# Patient Record
Sex: Female | Born: 1970 | Race: White | Hispanic: No | Marital: Married | Smoking: Former smoker
Health system: Southern US, Community
[De-identification: ages and names within clinical notes are randomized; demographics above are authoritative.]

## PROBLEM LIST (undated history)

## (undated) ENCOUNTER — Emergency Department (HOSPITAL_COMMUNITY): Admission: EM | Discharge status: Against Medical Advice | Payer: BC Managed Care – PPO

## (undated) DIAGNOSIS — M549 Dorsalgia, unspecified: Secondary | ICD-10-CM

## (undated) DIAGNOSIS — K219 Gastro-esophageal reflux disease without esophagitis: Secondary | ICD-10-CM

## (undated) DIAGNOSIS — G8929 Other chronic pain: Secondary | ICD-10-CM

## (undated) DIAGNOSIS — E079 Disorder of thyroid, unspecified: Secondary | ICD-10-CM

## (undated) HISTORY — DX: Disorder of thyroid, unspecified: E07.9

## (undated) HISTORY — DX: Dorsalgia, unspecified: M54.9

## (undated) HISTORY — PX: AUGMENTATION MAMMAPLASTY: SUR837

## (undated) HISTORY — DX: Other chronic pain: G89.29

## (undated) HISTORY — PX: APPENDECTOMY: SHX54

## (undated) HISTORY — DX: Gastro-esophageal reflux disease without esophagitis: K21.9

---

## 1996-01-01 HISTORY — PX: PLACEMENT OF BREAST IMPLANTS: SHX6334

## 2010-01-26 ENCOUNTER — Encounter: Admission: RE | Admit: 2010-01-26 | Discharge: 2010-01-26 | Payer: Self-pay | Admitting: Internal Medicine

## 2010-07-11 ENCOUNTER — Encounter: Admission: RE | Admit: 2010-07-11 | Discharge: 2010-07-11 | Payer: Self-pay | Admitting: Internal Medicine

## 2011-05-21 ENCOUNTER — Other Ambulatory Visit: Payer: Self-pay | Admitting: Internal Medicine

## 2011-05-21 DIAGNOSIS — E049 Nontoxic goiter, unspecified: Secondary | ICD-10-CM

## 2011-05-22 ENCOUNTER — Ambulatory Visit
Admission: RE | Admit: 2011-05-22 | Discharge: 2011-05-22 | Disposition: A | Discharge status: Home / Self Care | Payer: BC Managed Care – PPO | Source: Ambulatory Visit | Attending: Internal Medicine | Admitting: Internal Medicine

## 2011-05-22 DIAGNOSIS — E049 Nontoxic goiter, unspecified: Secondary | ICD-10-CM

## 2012-02-19 ENCOUNTER — Ambulatory Visit: Payer: Self-pay | Admitting: Family Medicine

## 2014-12-14 ENCOUNTER — Ambulatory Visit: Payer: Self-pay | Admitting: Unknown Physician Specialty

## 2015-05-02 ENCOUNTER — Ambulatory Visit: Admission: EM | Admit: 2015-05-02 | Discharge: 2015-05-02 | Discharge status: Absent without leave | Payer: Self-pay

## 2016-01-16 ENCOUNTER — Other Ambulatory Visit: Payer: Self-pay | Admitting: Physician Assistant

## 2016-01-16 DIAGNOSIS — R079 Chest pain, unspecified: Secondary | ICD-10-CM

## 2016-01-16 DIAGNOSIS — R1012 Left upper quadrant pain: Secondary | ICD-10-CM

## 2016-01-26 ENCOUNTER — Ambulatory Visit: Payer: Self-pay

## 2016-01-26 ENCOUNTER — Ambulatory Visit
Admission: RE | Admit: 2016-01-26 | Discharge: 2016-01-26 | Disposition: A | Discharge status: Home / Self Care | Payer: BLUE CROSS/BLUE SHIELD | Source: Ambulatory Visit | Attending: Physician Assistant | Admitting: Physician Assistant

## 2016-01-26 DIAGNOSIS — R079 Chest pain, unspecified: Secondary | ICD-10-CM | POA: Insufficient documentation

## 2016-01-26 DIAGNOSIS — R1012 Left upper quadrant pain: Secondary | ICD-10-CM | POA: Diagnosis present

## 2016-01-26 DIAGNOSIS — R911 Solitary pulmonary nodule: Secondary | ICD-10-CM | POA: Insufficient documentation

## 2016-01-26 DIAGNOSIS — K760 Fatty (change of) liver, not elsewhere classified: Secondary | ICD-10-CM | POA: Diagnosis not present

## 2016-01-26 MED ORDER — IOHEXOL 350 MG/ML SOLN
75.0000 mL | Freq: Once | INTRAVENOUS | Status: AC | PRN
  Administered 2016-01-26: 75 mL via INTRAVENOUS

## 2016-01-31 ENCOUNTER — Other Ambulatory Visit: Payer: Self-pay | Admitting: Physician Assistant

## 2016-01-31 DIAGNOSIS — R911 Solitary pulmonary nodule: Secondary | ICD-10-CM

## 2016-02-08 ENCOUNTER — Ambulatory Visit: Payer: BLUE CROSS/BLUE SHIELD

## 2016-04-03 ENCOUNTER — Ambulatory Visit
Admission: RE | Admit: 2016-04-03 | Discharge: 2016-04-03 | Disposition: A | Discharge status: Home / Self Care | Payer: BLUE CROSS/BLUE SHIELD | Source: Ambulatory Visit | Attending: Physician Assistant | Admitting: Physician Assistant

## 2016-04-03 DIAGNOSIS — R911 Solitary pulmonary nodule: Secondary | ICD-10-CM | POA: Insufficient documentation

## 2017-01-28 DIAGNOSIS — Z23 Encounter for immunization: Secondary | ICD-10-CM | POA: Diagnosis not present

## 2017-02-25 ENCOUNTER — Other Ambulatory Visit: Payer: Self-pay | Admitting: Family Medicine

## 2017-02-25 ENCOUNTER — Encounter: Payer: Self-pay | Admitting: Gastroenterology

## 2017-02-25 DIAGNOSIS — K219 Gastro-esophageal reflux disease without esophagitis: Secondary | ICD-10-CM | POA: Diagnosis not present

## 2017-02-25 DIAGNOSIS — Z87898 Personal history of other specified conditions: Secondary | ICD-10-CM

## 2017-02-25 DIAGNOSIS — M79622 Pain in left upper arm: Secondary | ICD-10-CM

## 2017-02-25 DIAGNOSIS — J309 Allergic rhinitis, unspecified: Secondary | ICD-10-CM | POA: Diagnosis not present

## 2017-02-25 DIAGNOSIS — E039 Hypothyroidism, unspecified: Secondary | ICD-10-CM | POA: Diagnosis not present

## 2017-02-25 DIAGNOSIS — Z23 Encounter for immunization: Secondary | ICD-10-CM | POA: Diagnosis not present

## 2017-02-25 DIAGNOSIS — Z6824 Body mass index (BMI) 24.0-24.9, adult: Secondary | ICD-10-CM | POA: Diagnosis not present

## 2017-02-27 ENCOUNTER — Ambulatory Visit
Admission: RE | Admit: 2017-02-27 | Discharge: 2017-02-27 | Disposition: A | Discharge status: Home / Self Care | Payer: BLUE CROSS/BLUE SHIELD | Source: Ambulatory Visit | Attending: Family Medicine | Admitting: Family Medicine

## 2017-02-27 ENCOUNTER — Other Ambulatory Visit: Payer: Self-pay | Admitting: Family Medicine

## 2017-02-27 DIAGNOSIS — Z87898 Personal history of other specified conditions: Secondary | ICD-10-CM

## 2017-02-27 DIAGNOSIS — N644 Mastodynia: Secondary | ICD-10-CM | POA: Diagnosis not present

## 2017-02-27 DIAGNOSIS — M79622 Pain in left upper arm: Secondary | ICD-10-CM

## 2017-02-27 DIAGNOSIS — R922 Inconclusive mammogram: Secondary | ICD-10-CM | POA: Diagnosis not present

## 2017-03-28 ENCOUNTER — Encounter: Payer: Self-pay | Admitting: Gastroenterology

## 2017-03-28 ENCOUNTER — Ambulatory Visit (INDEPENDENT_AMBULATORY_CARE_PROVIDER_SITE_OTHER): Payer: BLUE CROSS/BLUE SHIELD | Admitting: Gastroenterology

## 2017-03-28 VITALS — BP 114/70 | HR 72 | Ht 66.93 in | Wt 155.4 lb

## 2017-03-28 DIAGNOSIS — R1012 Left upper quadrant pain: Secondary | ICD-10-CM | POA: Diagnosis not present

## 2017-03-28 NOTE — Progress Notes (Signed)
HPI :  46 y/o female with a presenting with a history of chronic upper back pain and hypothyroidism, presenting for a new patient evaluation for several years of LUQ pain. .  Pain has been present for 8 years. Pain in the LUQ, tender to touch. Severity can fluctuate over time. Intense pain comes and goes, but baseline mild pain is always there, present 24/7 never goes away. On a good day rated 3/10, can be up to 10/10 at its worst. Severe pain has occurred about 5 times total to this level. Pain is a burning type of discomfort. She does not think eating will make it worse. No nausea or vomiting. She has rare loose stools but not common. She has a history of rectal discomfort for which she had a colonoscopy for in the past, which she was told she had hemorrhoids. Having a bowel movement does not take away the pain in her abdomen. No blood in the stools. She does have some occasional pyrosis. She was on prilosec which did not take away the pain in her abdomen. No weight loss, she is gaining weight. She can feel early satiety at times.   She had a colonoscopy around 4 years. She also had an upper endoscopy done for her pain around the same - which did not show a cause, although told she had some inflammation of her esophagus and benign stomach polyps.   She has had a CT scan of her chest in the past as well as MRI back. No abdominal imaging.   She was previously on lyrica which did not help her abdomen.   Father had lymphoma in his 5450s.  Past Medical History:  Diagnosis Date  . Chronic back pain   . GERD (gastroesophageal reflux disease)   . Thyroid disease      Past Surgical History:  Procedure Laterality Date  . APPENDECTOMY    . PLACEMENT OF BREAST IMPLANTS  1997   Family History  Problem Relation Age of Onset  . Diabetes Mother   . Osteoporosis Mother   . Bipolar disorder Mother   . Lymphoma Father   . Kidney disease Maternal Grandmother   . Diabetes Paternal Grandmother   .  Colon cancer Neg Hx   . Stomach cancer Neg Hx   . Rectal cancer Neg Hx   . Esophageal cancer Neg Hx   . Liver cancer Neg Hx    Social History  Substance Use Topics  . Smoking status: Former Games developermoker  . Smokeless tobacco: Never Used  . Alcohol use Yes     Comment: occasionally   Current Outpatient Prescriptions  Medication Sig Dispense Refill  . levothyroxine (SYNTHROID, LEVOTHROID) 125 MCG tablet TAKE 1 TABLET ONCE DAILY WITH A FULL GLASS OF WATER ON EMPTY STOMACH 30-60MINS BEFORE BREAKFAST     No current facility-administered medications for this visit.    No Known Allergies   Review of Systems: All systems reviewed and negative except where noted in HPI.   No results found for: WBC, HGB, HCT, MCV, PLT    Physical Exam: BP 114/70   Pulse 72   Ht 5' 6.93" (1.7 m)   Wt 155 lb 6 oz (70.5 kg)   BMI 24.39 kg/m  Constitutional: Pleasant,well-developed, female in no acute distress. HEENT: Normocephalic and atraumatic. Conjunctivae are normal. No scleral icterus. Neck supple.  Cardiovascular: Normal rate, regular rhythm.  Pulmonary/chest: Effort normal and breath sounds normal. No wheezing, rales or rhonchi. Abdominal: Soft, nondistended, focal tenderness to the  LUQ along costal margin. Carnett sign negative, There are no masses palpable. No hepatomegaly. Extremities: no edema Lymphadenopathy: No cervical adenopathy noted. Neurological: Alert and oriented to person place and time. Skin: Skin is warm and dry. No rashes noted. Psychiatric: Normal mood and affect. Behavior is normal.   ASSESSMENT AND PLAN: 46 year old female with chronic upper back pain, presenting with chronic left upper quadrant pain as described above. Pain is constant present 24/7, burning like quality, no relation to eating. Upper endoscopy and colonoscopy previously negative. She's had no prior abdominal imaging.  I suspect she most likely has abdominal wall pain / neuropathic pain given her description  of symptoms and stability over several years. That being said she's never had prior cross-sectional imaging of the abdomen from what I can gather, and recommend she have this done with a CT scan, ensure spleen and pancreas are normal. In the interim recommend a trial of capsaicin cream applied to the abdominal wall every 8 hours. If her CT scan is negative, which may likely be the case, and her symptoms persist, we can consider referral to pain management for trigger point injection / nerve block which may be the best way to manage this. I discussed abdominal wall pain at length with her and reassured her. She agreed with the plan.  Ileene Patrick, MD [facility] Pager [phone]  CC: Lewis Moccasin, MD

## 2017-03-28 NOTE — Patient Instructions (Addendum)
If you are age 44 or older, your body mass index should be between 23-30. Your Body mass index is 24.39 kg/m. If this is out of the aforementioned range listed, please consider follow up with your Primary Care Provider.  If you are age 46 or younger, your body mass index should be between 19-25. Your Body mass index is 24.39 kg/m. If this is out of the aformentioned range listed, please consider follow up with your Primary Care Provider.   You have been scheduled for a CT scan of the abdomen and pelvis at [facility] ([address]---this is in the same building as Press [facility]).   You are scheduled on Tuesday, April 10th at 2:00pm. You should arrive 15 minutes prior to your appointment time for registration. Please follow the written instructions below on the day of your exam:  WARNING: IF YOU ARE ALLERGIC TO IODINE/X-RAY DYE, PLEASE NOTIFY RADIOLOGY IMMEDIATELY AT [phone]! YOU WILL BE GIVEN A 13 HOUR PREMEDICATION PREP.  1) Do not eat anything after 10:00am (4 hours prior to your test) You may have liquids. 2) You have been given 2 bottles of oral contrast to drink. The solution may taste  better if refrigerated, but do NOT add ice or any other liquid to this solution. Shake well before drinking.    Drink 1 bottle of contrast @ 12:00pm (2 hours prior to your exam)  Drink 1 bottle of contrast @ 1:00pm    (1 hour prior to your exam)  You may take any medications as prescribed with a small amount of water except for the following: Metformin, Glucophage, Glucovance, Avandamet, Riomet, Fortamet, Actoplus Met, Janumet, Glumetza or Metaglip. The above medications must be held the day of the exam AND 48 hours after the exam.  The purpose of you drinking the oral contrast is to aid in the visualization of your intestinal tract. The contrast solution may cause some diarrhea. Before your exam is started, you will be given a small amount of fluid to drink. Depending on your  individual set of symptoms, you may also receive an intravenous injection of x-ray contrast/dye. Plan on being at [facility] for 30 minutes or longer, depending on the type of exam you are having performed.  This test typically takes 30-45 minutes to complete.  If you have any questions regarding your exam or if you need to reschedule, you may call the CT department at [phone] between the hours of 8:00 am and 5:00 pm, Monday-Friday.  ________________________________________________________________________  Please purchase Capsaicin Cream over the counter.  Will will contact you with the results of your CT Scan.  Thank you.

## 2017-04-03 ENCOUNTER — Other Ambulatory Visit: Payer: Self-pay

## 2017-04-03 DIAGNOSIS — R1012 Left upper quadrant pain: Secondary | ICD-10-CM

## 2017-04-09 ENCOUNTER — Ambulatory Visit (INDEPENDENT_AMBULATORY_CARE_PROVIDER_SITE_OTHER)
Admission: RE | Admit: 2017-04-09 | Discharge: 2017-04-09 | Disposition: A | Discharge status: Home / Self Care | Payer: BLUE CROSS/BLUE SHIELD | Source: Ambulatory Visit | Attending: Gastroenterology | Admitting: Gastroenterology

## 2017-04-09 ENCOUNTER — Inpatient Hospital Stay: Admission: RE | Admit: 2017-04-09 | Payer: BLUE CROSS/BLUE SHIELD | Source: Ambulatory Visit

## 2017-04-09 DIAGNOSIS — R1012 Left upper quadrant pain: Secondary | ICD-10-CM | POA: Diagnosis not present

## 2017-04-09 DIAGNOSIS — N200 Calculus of kidney: Secondary | ICD-10-CM | POA: Diagnosis not present

## 2017-04-09 MED ORDER — IOPAMIDOL (ISOVUE-300) INJECTION 61%
100.0000 mL | Freq: Once | INTRAVENOUS | Status: AC | PRN
  Administered 2017-04-09: 100 mL via INTRAVENOUS

## 2017-04-15 DIAGNOSIS — R609 Edema, unspecified: Secondary | ICD-10-CM | POA: Diagnosis not present

## 2017-04-15 DIAGNOSIS — E039 Hypothyroidism, unspecified: Secondary | ICD-10-CM | POA: Diagnosis not present

## 2017-04-19 DIAGNOSIS — E039 Hypothyroidism, unspecified: Secondary | ICD-10-CM | POA: Diagnosis not present

## 2017-04-19 DIAGNOSIS — H6123 Impacted cerumen, bilateral: Secondary | ICD-10-CM | POA: Diagnosis not present

## 2017-04-19 DIAGNOSIS — J309 Allergic rhinitis, unspecified: Secondary | ICD-10-CM | POA: Diagnosis not present

## 2017-04-19 DIAGNOSIS — I889 Nonspecific lymphadenitis, unspecified: Secondary | ICD-10-CM | POA: Diagnosis not present

## 2017-04-19 DIAGNOSIS — Z6824 Body mass index (BMI) 24.0-24.9, adult: Secondary | ICD-10-CM | POA: Diagnosis not present

## 2017-05-02 DIAGNOSIS — Z13220 Encounter for screening for lipoid disorders: Secondary | ICD-10-CM | POA: Diagnosis not present

## 2017-05-02 DIAGNOSIS — I889 Nonspecific lymphadenitis, unspecified: Secondary | ICD-10-CM | POA: Diagnosis not present

## 2017-05-02 DIAGNOSIS — Z136 Encounter for screening for cardiovascular disorders: Secondary | ICD-10-CM | POA: Diagnosis not present

## 2018-01-09 DIAGNOSIS — Z23 Encounter for immunization: Secondary | ICD-10-CM | POA: Diagnosis not present

## 2018-09-02 DIAGNOSIS — E039 Hypothyroidism, unspecified: Secondary | ICD-10-CM | POA: Diagnosis not present

## 2018-11-06 DIAGNOSIS — E039 Hypothyroidism, unspecified: Secondary | ICD-10-CM | POA: Diagnosis not present

## 2018-11-06 DIAGNOSIS — Z23 Encounter for immunization: Secondary | ICD-10-CM | POA: Diagnosis not present

## 2018-11-06 DIAGNOSIS — Z6823 Body mass index (BMI) 23.0-23.9, adult: Secondary | ICD-10-CM | POA: Diagnosis not present

## 2018-11-11 ENCOUNTER — Other Ambulatory Visit: Payer: Self-pay | Admitting: Family Medicine

## 2018-11-11 DIAGNOSIS — Z1231 Encounter for screening mammogram for malignant neoplasm of breast: Secondary | ICD-10-CM

## 2018-11-20 DIAGNOSIS — Z0000 Encounter for general adult medical examination without abnormal findings: Secondary | ICD-10-CM | POA: Diagnosis not present

## 2018-11-20 DIAGNOSIS — Z1329 Encounter for screening for other suspected endocrine disorder: Secondary | ICD-10-CM | POA: Diagnosis not present

## 2018-11-20 DIAGNOSIS — Z114 Encounter for screening for human immunodeficiency virus [HIV]: Secondary | ICD-10-CM | POA: Diagnosis not present

## 2018-11-20 DIAGNOSIS — Z13220 Encounter for screening for lipoid disorders: Secondary | ICD-10-CM | POA: Diagnosis not present

## 2018-11-24 DIAGNOSIS — Z0000 Encounter for general adult medical examination without abnormal findings: Secondary | ICD-10-CM | POA: Diagnosis not present

## 2018-11-24 DIAGNOSIS — Z6823 Body mass index (BMI) 23.0-23.9, adult: Secondary | ICD-10-CM | POA: Diagnosis not present

## 2019-01-12 ENCOUNTER — Ambulatory Visit
Admission: RE | Admit: 2019-01-12 | Discharge: 2019-01-12 | Disposition: A | Discharge status: Home / Self Care | Payer: BLUE CROSS/BLUE SHIELD | Source: Ambulatory Visit | Attending: Family Medicine | Admitting: Family Medicine

## 2019-01-12 DIAGNOSIS — Z1231 Encounter for screening mammogram for malignant neoplasm of breast: Secondary | ICD-10-CM | POA: Diagnosis not present

## 2019-01-13 ENCOUNTER — Other Ambulatory Visit: Payer: Self-pay | Admitting: Family Medicine

## 2019-01-13 DIAGNOSIS — R928 Other abnormal and inconclusive findings on diagnostic imaging of breast: Secondary | ICD-10-CM

## 2019-01-16 ENCOUNTER — Ambulatory Visit: Payer: BLUE CROSS/BLUE SHIELD

## 2019-01-16 ENCOUNTER — Ambulatory Visit
Admission: RE | Admit: 2019-01-16 | Discharge: 2019-01-16 | Disposition: A | Discharge status: Home / Self Care | Payer: BLUE CROSS/BLUE SHIELD | Source: Ambulatory Visit | Attending: Family Medicine | Admitting: Family Medicine

## 2019-01-16 DIAGNOSIS — R928 Other abnormal and inconclusive findings on diagnostic imaging of breast: Secondary | ICD-10-CM

## 2019-01-16 DIAGNOSIS — R921 Mammographic calcification found on diagnostic imaging of breast: Secondary | ICD-10-CM | POA: Diagnosis not present

## 2019-06-02 DIAGNOSIS — E039 Hypothyroidism, unspecified: Secondary | ICD-10-CM | POA: Diagnosis not present

## 2019-06-02 DIAGNOSIS — E785 Hyperlipidemia, unspecified: Secondary | ICD-10-CM | POA: Diagnosis not present

## 2019-06-04 DIAGNOSIS — Z719 Counseling, unspecified: Secondary | ICD-10-CM | POA: Diagnosis not present

## 2019-06-04 DIAGNOSIS — R768 Other specified abnormal immunological findings in serum: Secondary | ICD-10-CM | POA: Diagnosis not present

## 2019-06-04 DIAGNOSIS — E039 Hypothyroidism, unspecified: Secondary | ICD-10-CM | POA: Diagnosis not present

## 2019-06-04 DIAGNOSIS — E782 Mixed hyperlipidemia: Secondary | ICD-10-CM | POA: Diagnosis not present

## 2019-10-01 DIAGNOSIS — E039 Hypothyroidism, unspecified: Secondary | ICD-10-CM | POA: Diagnosis not present

## 2019-10-01 DIAGNOSIS — E785 Hyperlipidemia, unspecified: Secondary | ICD-10-CM | POA: Diagnosis not present

## 2019-10-05 DIAGNOSIS — E039 Hypothyroidism, unspecified: Secondary | ICD-10-CM | POA: Diagnosis not present

## 2019-12-23 DIAGNOSIS — E039 Hypothyroidism, unspecified: Secondary | ICD-10-CM | POA: Diagnosis not present

## 2019-12-23 DIAGNOSIS — E785 Hyperlipidemia, unspecified: Secondary | ICD-10-CM | POA: Diagnosis not present

## 2019-12-28 DIAGNOSIS — E039 Hypothyroidism, unspecified: Secondary | ICD-10-CM | POA: Diagnosis not present

## 2020-01-14 DIAGNOSIS — D485 Neoplasm of uncertain behavior of skin: Secondary | ICD-10-CM | POA: Diagnosis not present

## 2020-01-14 DIAGNOSIS — L720 Epidermal cyst: Secondary | ICD-10-CM | POA: Diagnosis not present

## 2020-01-14 DIAGNOSIS — L0100 Impetigo, unspecified: Secondary | ICD-10-CM | POA: Diagnosis not present

## 2020-01-14 DIAGNOSIS — L821 Other seborrheic keratosis: Secondary | ICD-10-CM | POA: Diagnosis not present

## 2020-02-08 DIAGNOSIS — F410 Panic disorder [episodic paroxysmal anxiety] without agoraphobia: Secondary | ICD-10-CM | POA: Diagnosis not present

## 2020-02-08 DIAGNOSIS — E039 Hypothyroidism, unspecified: Secondary | ICD-10-CM | POA: Diagnosis not present

## 2020-02-08 DIAGNOSIS — K219 Gastro-esophageal reflux disease without esophagitis: Secondary | ICD-10-CM | POA: Diagnosis not present

## 2020-02-08 DIAGNOSIS — M25519 Pain in unspecified shoulder: Secondary | ICD-10-CM | POA: Diagnosis not present

## 2020-05-06 ENCOUNTER — Ambulatory Visit: Payer: BLUE CROSS/BLUE SHIELD | Attending: Internal Medicine

## 2020-05-06 DIAGNOSIS — Z23 Encounter for immunization: Secondary | ICD-10-CM

## 2020-05-06 NOTE — Progress Notes (Signed)
   Covid-19 Vaccination Clinic  Name:  Brittney Schneider    MRN: [id number] DOB: October 16, 1971  05/06/2020  [name] was observed post Covid-19 immunization for 15 minutes without incident. She was provided with Vaccine Information Sheet and instruction to access the V-Safe system.   [name] was instructed to call 911 with any severe reactions post vaccine: Marland Kitchen Difficulty breathing  . Swelling of face and throat  . A fast heartbeat  . A bad rash all over body  . Dizziness and weakness   Immunizations Administered    Name Date Dose VIS Date Route   Pfizer COVID-19 Vaccine 05/06/2020 10:53 AM 0.3 mL 02/24/2019 Intramuscular   Manufacturer: [employer], [employer]   Lot: [id number]   NDC: [address]-3

## 2020-05-27 ENCOUNTER — Ambulatory Visit: Payer: Self-pay | Attending: Internal Medicine

## 2020-05-27 DIAGNOSIS — Z23 Encounter for immunization: Secondary | ICD-10-CM

## 2020-05-27 NOTE — Progress Notes (Signed)
   Covid-19 Vaccination Clinic  Name:  Brittney Schneider    MRN: [id number] DOB: May 19, 1971  05/27/2020  Brittney Schneider was observed post Covid-19 immunization for 15 minutes without incident. She was provided with Vaccine Information Sheet and instruction to access the V-Safe system.   Brittney Schneider was instructed to call 911 with any severe reactions post vaccine: Marland Kitchen Difficulty breathing  . Swelling of face and throat  . A fast heartbeat  . A bad rash all over body  . Dizziness and weakness   Immunizations Administered    Name Date Dose VIS Date Route   Pfizer COVID-19 Vaccine 05/27/2020  4:32 PM 0.3 mL 02/24/2019 Intramuscular   Manufacturer: [employer], [employer]   Lot: [id number]   NDC: [address]-6

## 2020-06-01 DIAGNOSIS — E039 Hypothyroidism, unspecified: Secondary | ICD-10-CM | POA: Diagnosis not present

## 2020-06-01 DIAGNOSIS — B356 Tinea cruris: Secondary | ICD-10-CM | POA: Diagnosis not present

## 2020-06-01 DIAGNOSIS — Z0000 Encounter for general adult medical examination without abnormal findings: Secondary | ICD-10-CM | POA: Diagnosis not present

## 2020-06-01 DIAGNOSIS — Z118 Encounter for screening for other infectious and parasitic diseases: Secondary | ICD-10-CM | POA: Diagnosis not present

## 2020-06-01 DIAGNOSIS — Z1151 Encounter for screening for human papillomavirus (HPV): Secondary | ICD-10-CM | POA: Diagnosis not present

## 2020-06-01 DIAGNOSIS — Z713 Dietary counseling and surveillance: Secondary | ICD-10-CM | POA: Diagnosis not present

## 2020-06-01 DIAGNOSIS — Z113 Encounter for screening for infections with a predominantly sexual mode of transmission: Secondary | ICD-10-CM | POA: Diagnosis not present

## 2020-06-01 DIAGNOSIS — Z1211 Encounter for screening for malignant neoplasm of colon: Secondary | ICD-10-CM | POA: Diagnosis not present

## 2020-06-01 DIAGNOSIS — Z7182 Exercise counseling: Secondary | ICD-10-CM | POA: Diagnosis not present

## 2020-06-01 DIAGNOSIS — Z01419 Encounter for gynecological examination (general) (routine) without abnormal findings: Secondary | ICD-10-CM | POA: Diagnosis not present

## 2020-06-01 DIAGNOSIS — H6121 Impacted cerumen, right ear: Secondary | ICD-10-CM | POA: Diagnosis not present

## 2020-06-03 ENCOUNTER — Other Ambulatory Visit: Payer: Self-pay | Admitting: Family Medicine

## 2020-06-03 DIAGNOSIS — Z1231 Encounter for screening mammogram for malignant neoplasm of breast: Secondary | ICD-10-CM

## 2020-06-10 ENCOUNTER — Other Ambulatory Visit: Payer: Self-pay

## 2020-06-10 ENCOUNTER — Ambulatory Visit
Admission: RE | Admit: 2020-06-10 | Discharge: 2020-06-10 | Disposition: A | Discharge status: Home / Self Care | Payer: Self-pay | Source: Ambulatory Visit | Attending: Family Medicine | Admitting: Family Medicine

## 2020-06-10 DIAGNOSIS — Z1231 Encounter for screening mammogram for malignant neoplasm of breast: Secondary | ICD-10-CM | POA: Diagnosis not present

## 2020-07-13 DIAGNOSIS — E039 Hypothyroidism, unspecified: Secondary | ICD-10-CM | POA: Diagnosis not present

## 2020-07-15 DIAGNOSIS — G4700 Insomnia, unspecified: Secondary | ICD-10-CM | POA: Diagnosis not present

## 2020-07-15 DIAGNOSIS — K219 Gastro-esophageal reflux disease without esophagitis: Secondary | ICD-10-CM | POA: Diagnosis not present

## 2020-07-15 DIAGNOSIS — E039 Hypothyroidism, unspecified: Secondary | ICD-10-CM | POA: Diagnosis not present

## 2020-07-15 DIAGNOSIS — F411 Generalized anxiety disorder: Secondary | ICD-10-CM | POA: Diagnosis not present

## 2020-10-11 DIAGNOSIS — L03019 Cellulitis of unspecified finger: Secondary | ICD-10-CM | POA: Diagnosis not present

## 2020-12-13 DIAGNOSIS — E039 Hypothyroidism, unspecified: Secondary | ICD-10-CM | POA: Diagnosis not present

## 2020-12-16 DIAGNOSIS — K219 Gastro-esophageal reflux disease without esophagitis: Secondary | ICD-10-CM | POA: Diagnosis not present

## 2020-12-16 DIAGNOSIS — F331 Major depressive disorder, recurrent, moderate: Secondary | ICD-10-CM | POA: Diagnosis not present

## 2020-12-16 DIAGNOSIS — E039 Hypothyroidism, unspecified: Secondary | ICD-10-CM | POA: Diagnosis not present

## 2020-12-16 DIAGNOSIS — F411 Generalized anxiety disorder: Secondary | ICD-10-CM | POA: Diagnosis not present

## 2021-01-02 DIAGNOSIS — U071 COVID-19: Secondary | ICD-10-CM | POA: Diagnosis not present

## 2021-01-05 DIAGNOSIS — U071 COVID-19: Secondary | ICD-10-CM | POA: Diagnosis not present

## 2021-01-12 ENCOUNTER — Other Ambulatory Visit: Payer: Self-pay

## 2021-01-12 ENCOUNTER — Ambulatory Visit
Admission: EM | Admit: 2021-01-12 | Discharge: 2021-01-12 | Disposition: A | Discharge status: Home / Self Care | Payer: BC Managed Care – PPO | Attending: Emergency Medicine | Admitting: Emergency Medicine

## 2021-01-12 DIAGNOSIS — R109 Unspecified abdominal pain: Secondary | ICD-10-CM

## 2021-01-12 LAB — POCT URINALYSIS DIP (MANUAL ENTRY)
Bilirubin, UA: NEGATIVE
Glucose, UA: NEGATIVE mg/dL
Leukocytes, UA: NEGATIVE
Nitrite, UA: NEGATIVE
Protein Ur, POC: NEGATIVE mg/dL
Spec Grav, UA: 1.01 (ref 1.010–1.025)
Urobilinogen, UA: 0.2 E.U./dL
pH, UA: 6 (ref 5.0–8.0)

## 2021-01-12 MED ORDER — TIZANIDINE HCL 4 MG PO TABS: 4.0000 mg | ORAL_TABLET | Freq: Four times a day (QID) | ORAL | 0 refills | Status: AC | PRN

## 2021-01-12 MED ORDER — TAMSULOSIN HCL 0.4 MG PO CAPS: 0.4000 mg | ORAL_CAPSULE | Freq: Every day | ORAL | 0 refills | Status: AC

## 2021-01-12 MED ORDER — NAPROXEN 500 MG PO TABS: 500.0000 mg | ORAL_TABLET | Freq: Two times a day (BID) | ORAL | 0 refills | Status: AC

## 2021-01-12 NOTE — ED Provider Notes (Signed)
[facility]    CSN: [id number] Arrival date & time: 01/12/21  1623      History   Chief Complaint Chief Complaint  Patient presents with  . Flank Pain    Left side    HPI Brittney Schneider is a 50 y.o. female presenting today for evaluation of flank pain.  Patient reports that she has had sharp pain in her left flank beginning last night.  More frequent and intense today.  Pain comes and sharp waves that are brief in nature.  Is a constant soreness at rest.  Denies any injury or trauma.  Did recently had COVID, but denies any significant cough or shortness of breath.  Symptoms have relatively fully resolved from COVID infection.  She denies any urinary symptoms of dysuria, increased frequency urgency or hematuria.  Denies history of kidney stones.  Pain does not seem to be triggered by certain movements or positions.  Pain does not radiate into lower extremities or abdomen.  Eating and drinking normally without affecting symptoms.  Reports slightly more constipated today, but otherwise well as normal.  HPI  Past Medical History:  Diagnosis Date  . Chronic back pain   . GERD (gastroesophageal reflux disease)   . Thyroid disease     There are no problems to display for this patient.   Past Surgical History:  Procedure Laterality Date  . APPENDECTOMY    . AUGMENTATION MAMMAPLASTY    . PLACEMENT OF BREAST IMPLANTS  1997    OB History   No obstetric history on file.      Home Medications    Prior to Admission medications   Medication Sig Start Date End Date Taking? Authorizing Provider  levothyroxine (SYNTHROID, LEVOTHROID) 125 MCG tablet TAKE 1 TABLET ONCE DAILY WITH A FULL GLASS OF WATER ON EMPTY STOMACH 30-60MINS BEFORE BREAKFAST 08/17/16  Yes [provider]  naproxen (NAPROSYN) 500 MG tablet Take 1 tablet (500 mg total) by mouth 2 (two) times daily. 01/12/21  Yes Kimiyah Blick C, PA-C  tamsulosin (FLOMAX) 0.4 MG CAPS capsule Take 1  capsule (0.4 mg total) by mouth daily. 01/12/21  Yes Brittnee Gaetano C, PA-C  tiZANidine (ZANAFLEX) 4 MG tablet Take 1 tablet (4 mg total) by mouth every 6 (six) hours as needed for muscle spasms. 01/12/21  Yes Tavien Chestnut, Junius Creamer, PA-C    Family History Family History  Problem Relation Age of Onset  . Diabetes Mother   . Osteoporosis Mother   . Bipolar disorder Mother   . Lymphoma Father   . Kidney disease Maternal Grandmother   . Diabetes Paternal Grandmother   . Colon cancer Neg Hx   . Stomach cancer Neg Hx   . Rectal cancer Neg Hx   . Esophageal cancer Neg Hx   . Liver cancer Neg Hx     Social History Social History   Tobacco Use  . Smoking status: Former Games developer  . Smokeless tobacco: Never Used  Vaping Use  . Vaping Use: Never used  Substance Use Topics  . Alcohol use: Yes    Comment: occasionally  . Drug use: No     Allergies   Patient has no known allergies.   Review of Systems Review of Systems  Constitutional: Negative for fever.  Respiratory: Negative for shortness of breath.   Cardiovascular: Negative for chest pain.  Gastrointestinal: Negative for abdominal pain, diarrhea, nausea and vomiting.  Genitourinary: Positive for flank pain. Negative for dysuria, genital sores, hematuria, menstrual problem, vaginal bleeding,  vaginal discharge and vaginal pain.  Musculoskeletal: Negative for back pain.  Skin: Negative for rash.  Neurological: Negative for dizziness, light-headedness and headaches.     Physical Exam Triage Vital Signs ED Triage Vitals  Enc Vitals Group     BP 01/12/21 1643 134/85     Pulse Rate 01/12/21 1643 85     Resp 01/12/21 1643 20     Temp 01/12/21 1643 97.8 F (36.6 C)     Temp Source 01/12/21 1643 Oral     SpO2 01/12/21 1643 98 %     Weight --      Height --      Head Circumference --      Peak Flow --      Pain Score 01/12/21 1702 6     Pain Loc --      Pain Edu? --      Excl. in GC? --    No data found.  Updated Vital  Signs BP 134/85 (BP Location: Left Arm)   Pulse 85   Temp 97.8 F (36.6 C) (Oral)   Resp 20   SpO2 98%   Visual Acuity Right Eye Distance:   Left Eye Distance:   Bilateral Distance:    Right Eye Near:   Left Eye Near:    Bilateral Near:     Physical Exam Vitals and nursing note reviewed.  Constitutional:      Appearance: She is well-developed and well-nourished.     Comments: No acute distress  HENT:     Head: Normocephalic and atraumatic.     Nose: Nose normal.  Eyes:     Conjunctiva/sclera: Conjunctivae normal.  Cardiovascular:     Rate and Rhythm: Normal rate and regular rhythm.  Pulmonary:     Effort: Pulmonary effort is normal. No respiratory distress.     Comments: Breathing comfortably at rest, CTABL, no wheezing, rales or other adventitious sounds auscultated Abdominal:     General: There is no distension.     Comments: Soft, nondistended, nontender to light and deep palpation throughout abdomen  Musculoskeletal:        General: Normal range of motion.     Cervical back: Neck supple.     Comments: Nontender to palpation of thoracic and lumbar spine midline, mild tenderness to palpation to upper lumbar musculature on the left and lower thoracic area  Skin:    General: Skin is warm and dry.  Neurological:     Mental Status: She is alert and oriented to person, place, and time.  Psychiatric:        Mood and Affect: Mood and affect normal.      UC Treatments / Results  Labs (all labs ordered are listed, but only abnormal results are displayed) Labs Reviewed  POCT URINALYSIS DIP (MANUAL ENTRY) - Abnormal; Notable for the following components:      Result Value   Ketones, POC UA trace (5) (*)    Blood, UA trace-intact (*)    All other components within normal limits  BASIC METABOLIC PANEL    EKG   Radiology No results found.  Procedures Procedures (including critical care time)  Medications Ordered in UC Medications - No data to  display  Initial Impression / Assessment and Plan / UC Course  I have reviewed the triage vital signs and the nursing notes.  Pertinent labs & imaging results that were available during my care of the patient were reviewed by me and considered in my medical decision making (  see chart for details).  Clinical Course as of 01/12/21 1832  Thu Jan 12, 2021  1746 Ketones, UA(!): trace (5) [HW]    Clinical Course User Index [HW] Deanette Tullius C, PA-C    Trace hemoglobin, small ketones, negative signs of infection.  Cannot rule out underlying stone versus MSK etiology.  Checking BMP to ensure kidney function stable.  Recommending treatment with anti-inflammatories, muscle relaxers, Flomax daily and continue to monitor urine.  If symptoms changing or worsening to follow-up for reevaluation.  Discussed strict return precautions. Patient verbalized understanding and is agreeable with plan.  Final Clinical Impressions(s) / UC Diagnoses   Final diagnoses:  Left flank pain     Discharge Instructions     Blood work pending for kidney function (BUN, Creatinine) Small amount of blood in urine, possible stone Naprosyn twice daily for pain Flomax daily to help pass any underlying stone May supplement with tizanidine as needed for back pain/spasms, may cause drowsiness, limit use to at home and bedtime, do not drive after use    ED Prescriptions    Medication Sig Dispense Auth. Provider   naproxen (NAPROSYN) 500 MG tablet Take 1 tablet (500 mg total) by mouth 2 (two) times daily. 30 tablet Somya Jauregui C, PA-C   tiZANidine (ZANAFLEX) 4 MG tablet Take 1 tablet (4 mg total) by mouth every 6 (six) hours as needed for muscle spasms. 30 tablet Naylee Frankowski C, PA-C   tamsulosin (FLOMAX) 0.4 MG CAPS capsule Take 1 capsule (0.4 mg total) by mouth daily. 7 capsule Brodin Gelpi, Porter C, PA-C     PDMP not reviewed this encounter.   Lew Dawes, [place] 01/12/21 1832

## 2021-01-12 NOTE — ED Triage Notes (Signed)
Patient states she has had sharp pains in her left flank since last night. Pt states the pains have become more frequent and intense today. Pt is concerned it may be her kidney. Pt is aox4 and ambulatory.

## 2021-01-12 NOTE — Discharge Instructions (Addendum)
Blood work pending for kidney function (BUN, Creatinine) Small amount of blood in urine, possible stone Naprosyn twice daily for pain Flomax daily to help pass any underlying stone May supplement with tizanidine as needed for back pain/spasms, may cause drowsiness, limit use to at home and bedtime, do not drive after use

## 2021-01-13 LAB — BASIC METABOLIC PANEL
BUN/Creatinine Ratio: 25 — ABNORMAL HIGH (ref 9–23)
BUN: 13 mg/dL (ref 6–24)
CO2: 23 mmol/L (ref 20–29)
Calcium: 9.4 mg/dL (ref 8.7–10.2)
Chloride: 102 mmol/L (ref 96–106)
Creatinine, Ser: 0.53 mg/dL — ABNORMAL LOW (ref 0.57–1.00)
GFR calc Af Amer: 131 mL/min/{1.73_m2} (ref >59)
GFR calc non Af Amer: 113 mL/min/{1.73_m2} (ref >59)
Glucose: 86 mg/dL (ref 65–99)
Potassium: 3.9 mmol/L (ref 3.5–5.2)
Sodium: 141 mmol/L (ref 134–144)

## 2021-01-14 ENCOUNTER — Emergency Department
Admission: EM | Admit: 2021-01-14 | Discharge: 2021-01-14 | Disposition: A | Discharge status: Home / Self Care | Payer: BC Managed Care – PPO | Attending: Emergency Medicine | Admitting: Emergency Medicine

## 2021-01-14 ENCOUNTER — Other Ambulatory Visit: Payer: Self-pay

## 2021-01-14 ENCOUNTER — Emergency Department: Payer: BC Managed Care – PPO

## 2021-01-14 ENCOUNTER — Encounter: Payer: Self-pay | Admitting: Emergency Medicine

## 2021-01-14 DIAGNOSIS — R319 Hematuria, unspecified: Secondary | ICD-10-CM | POA: Diagnosis not present

## 2021-01-14 DIAGNOSIS — M79605 Pain in left leg: Secondary | ICD-10-CM | POA: Diagnosis not present

## 2021-01-14 DIAGNOSIS — Z87891 Personal history of nicotine dependence: Secondary | ICD-10-CM | POA: Diagnosis not present

## 2021-01-14 DIAGNOSIS — U071 COVID-19: Secondary | ICD-10-CM | POA: Insufficient documentation

## 2021-01-14 DIAGNOSIS — M79662 Pain in left lower leg: Secondary | ICD-10-CM | POA: Diagnosis not present

## 2021-01-14 LAB — COMPREHENSIVE METABOLIC PANEL
ALT: 17 U/L (ref 0–44)
AST: 21 U/L (ref 15–41)
Albumin: 4 g/dL (ref 3.5–5.0)
Alkaline Phosphatase: 50 U/L (ref 38–126)
Anion gap: 11 (ref 5–15)
BUN: 17 mg/dL (ref 6–20)
CO2: 25 mmol/L (ref 22–32)
Calcium: 8.8 mg/dL — ABNORMAL LOW (ref 8.9–10.3)
Chloride: 104 mmol/L (ref 98–111)
Creatinine, Ser: 0.5 mg/dL (ref 0.44–1.00)
GFR, Estimated: >60 mL/min (ref >60)
Glucose, Bld: 95 mg/dL (ref 70–99)
Potassium: 3.7 mmol/L (ref 3.5–5.1)
Sodium: 140 mmol/L (ref 135–145)
Total Bilirubin: 1.2 mg/dL (ref 0.3–1.2)
Total Protein: 7.1 g/dL (ref 6.5–8.1)

## 2021-01-14 LAB — URINALYSIS, COMPLETE (UACMP) WITH MICROSCOPIC
Bacteria, UA: NONE SEEN
Bilirubin Urine: NEGATIVE
Glucose, UA: NEGATIVE mg/dL
Hgb urine dipstick: NEGATIVE
Ketones, ur: NEGATIVE mg/dL
Leukocytes,Ua: NEGATIVE
Nitrite: NEGATIVE
Protein, ur: NEGATIVE mg/dL
Specific Gravity, Urine: 1.019 (ref 1.005–1.030)
pH: 5 (ref 5.0–8.0)

## 2021-01-14 LAB — CBC
HCT: 40.5 % (ref 36.0–46.0)
Hemoglobin: 13.7 g/dL (ref 12.0–15.0)
MCH: 28.5 pg (ref 26.0–34.0)
MCHC: 33.8 g/dL (ref 30.0–36.0)
MCV: 84.4 fL (ref 80.0–100.0)
Platelets: 378 10*3/uL (ref 150–400)
RBC: 4.8 MIL/uL (ref 3.87–5.11)
RDW: 13.5 % (ref 11.5–15.5)
WBC: 9.3 10*3/uL (ref 4.0–10.5)
nRBC: 0 % (ref 0.0–0.2)

## 2021-01-14 NOTE — ED Provider Notes (Signed)
[facility] Emergency Department Provider Note  ____________________________________________   Event Date/Time   First MD Initiated Contact with Patient 01/14/21 [phone]     (approximate)  I have reviewed the triage vital signs and the nursing notes.   HISTORY  Chief Complaint Hematuria    HPI Brittney Schneider is a 50 y.o. female with thyroid disease who comes in for hematuria and leg pain.  Patient states that she was diagnosed with COVID in the early January.  She is out of quarantine.  She states that her nurse started her on a baby aspirin daily and she is now worried because she has blood in her urine.  She states that she had some left flank pain that is now since resolved.  Her urine is not really bloody but just dark tinted.  She was worried that she could have internal bleeding from the aspirin.  She states that she has had some left lower leg pain and is worried she could have a blood clot.  The pain has been constant, few days, nothing makes it better, nothing makes it worse.  She was not sure if the aspirin was causing her to be bleeding and she want to make sure that there was no other issues.  She otherwise has been doing well recovering from COVID.          Past Medical History:  Diagnosis Date  . Chronic back pain   . GERD (gastroesophageal reflux disease)   . Thyroid disease     There are no problems to display for this patient.   Past Surgical History:  Procedure Laterality Date  . APPENDECTOMY    . AUGMENTATION MAMMAPLASTY    . PLACEMENT OF BREAST IMPLANTS  1997    Prior to Admission medications   Medication Sig Start Date End Date Taking? Authorizing Provider  levothyroxine (SYNTHROID, LEVOTHROID) 125 MCG tablet TAKE 1 TABLET ONCE DAILY WITH A FULL GLASS OF WATER ON EMPTY STOMACH 30-60MINS BEFORE BREAKFAST 08/17/16   [provider]  naproxen (NAPROSYN) 500 MG tablet Take 1 tablet (500 mg total) by mouth 2 (two)  times daily. 01/12/21   Wieters, Hallie C, PA-C  tamsulosin (FLOMAX) 0.4 MG CAPS capsule Take 1 capsule (0.4 mg total) by mouth daily. 01/12/21   Wieters, Hallie C, PA-C  tiZANidine (ZANAFLEX) 4 MG tablet Take 1 tablet (4 mg total) by mouth every 6 (six) hours as needed for muscle spasms. 01/12/21   Wieters, Hallie C, PA-C    Allergies Benadryl [diphenhydramine]  Family History  Problem Relation Age of Onset  . Diabetes Mother   . Osteoporosis Mother   . Bipolar disorder Mother   . Lymphoma Father   . Kidney disease Maternal Grandmother   . Diabetes Paternal Grandmother   . Colon cancer Neg Hx   . Stomach cancer Neg Hx   . Rectal cancer Neg Hx   . Esophageal cancer Neg Hx   . Liver cancer Neg Hx     Social History Social History   Tobacco Use  . Smoking status: Former Games developer  . Smokeless tobacco: Never Used  Vaping Use  . Vaping Use: Never used  Substance Use Topics  . Alcohol use: Yes    Comment: occasionally  . Drug use: No      Review of Systems Constitutional: No fever/chills Eyes: No visual changes. ENT: No sore throat. Cardiovascular: Denies chest pain. Respiratory: Denies shortness of breath. Gastrointestinal: No abdominal pain.  No nausea, no vomiting.  No diarrhea.  No constipation. Genitourinary: Negative for dysuria.  Dark urine Musculoskeletal: Negative for back pain.  Leg pain Skin: Negative for rash. Neurological: Negative for headaches, focal weakness or numbness. All other ROS negative ____________________________________________   PHYSICAL EXAM:  VITAL SIGNS: ED Triage Vitals  Enc Vitals Group     BP 01/14/21 0839 115/86     Pulse Rate 01/14/21 0839 85     Resp 01/14/21 0839 20     Temp 01/14/21 0845 98.2 F (36.8 C)     Temp Source 01/14/21 0845 Oral     SpO2 01/14/21 0839 98 %     Weight 01/14/21 0842 141 lb (64 kg)     Height 01/14/21 0842 5\' 7"  (1.702 m)     Head Circumference --      Peak Flow --      Pain Score 01/14/21 0841 0      Pain Loc --      Pain Edu? --      Excl. in GC? --     Constitutional: Alert and oriented. Well appearing and in no acute distress. Eyes: Conjunctivae are normal. EOMI. Head: Atraumatic. Nose: No congestion/rhinnorhea. Mouth/Throat: Mucous membranes are moist.   Neck: No stridor. Trachea Midline. FROM Cardiovascular: Normal rate, regular rhythm. Grossly normal heart sounds.  Good peripheral circulation. Respiratory: Normal respiratory effort.  No retractions. Lungs CTAB. Gastrointestinal: Soft and nontender. No distention. No abdominal bruits.  Musculoskeletal: No lower extremity tenderness nor edema.  No joint effusions.  Warm well perfused left leg.  No trauma. Neurologic:  Normal speech and language. No gross focal neurologic deficits are appreciated.  Skin:  Skin is warm, dry and intact. No rash noted. Psychiatric: Mood and affect are normal. Speech and behavior are normal. GU: Deferred   ____________________________________________   LABS (all labs ordered are listed, but only abnormal results are displayed)  Labs Reviewed  URINALYSIS, COMPLETE (UACMP) WITH MICROSCOPIC - Abnormal; Notable for the following components:      Result Value   Color, Urine YELLOW (*)    APPearance HAZY (*)    All other components within normal limits  COMPREHENSIVE METABOLIC PANEL - Abnormal; Notable for the following components:   Calcium 8.8 (*)    All other components within normal limits  CBC   ____________________________________________  RADIOLOGY   Official radiology report(s): 01/16/21 Venous Img Lower Unilateral Left  Result Date: 01/14/2021 CLINICAL DATA:  Left lower extremity pain post COVID for 3 days. EXAM: LEFT LOWER EXTREMITY VENOUS DOPPLER ULTRASOUND TECHNIQUE: Gray-scale sonography with compression, as well as color and duplex ultrasound, were performed to evaluate the deep venous system(s) from the level of the common femoral vein through the popliteal and proximal calf  veins. COMPARISON:  None. FINDINGS: VENOUS Normal compressibility of the common femoral, superficial femoral, and popliteal veins, as well as the visualized calf veins. Visualized portions of profunda femoral vein and great saphenous vein unremarkable. No filling defects to suggest DVT on grayscale or color Doppler imaging. Doppler waveforms show normal direction of venous flow, normal respiratory plasticity and response to augmentation. Limited views of the contralateral common femoral vein are unremarkable. OTHER None. Limitations: none IMPRESSION: No evidence of deep venous thrombosis in the left lower extremity. Electronically Signed   By: 01/16/2021 M.D.   On: 01/14/2021 10:35    ____________________________________________   PROCEDURES  Procedure(s) performed (including Critical Care):  Procedures   ____________________________________________   INITIAL IMPRESSION / ASSESSMENT AND PLAN / ED COURSE  01/16/2021  Jashley Yellin was evaluated in Emergency Department on 01/14/2021 for the symptoms described in the history of present illness. She was evaluated in the context of the global COVID-19 pandemic, which necessitated consideration that the patient might be at risk for infection with the SARS-CoV-2 virus that causes COVID-19. Institutional protocols and algorithms that pertain to the evaluation of patients at risk for COVID-19 are in a state of rapid change based on information released by regulatory bodies including the CDC and federal and state organizations. These policies and algorithms were followed during the patient's care in the ED.    Patient is a 50 year old who comes in with left flank pain and some dark urine.  For the left leg pain will get ultrasound evaluate for DVT.  She denies any trauma to suggest fractures.  Reports the leg is warm and denies concerns for being cold suggest tibial issue.  With dark urine this could be a UTI versus kidney stone.  Will get UA to further  evaluate.  We discussed the possibility of kidney stone but her flank pain has since resolved.  We discussed doing a CT scan to see if she still has a stone versus the possibility that she already passed the stone given her pain has since resolved.  At this time she like to hold off on CT scan given she has not had any pain.  We discussed the aspirin use and how it would cause more of an issue with gastritis and blood in her stool.  However she denies any dark stools.  If her DVT ultrasound is negative we discussed holding the aspirin.  She otherwise is hemodynamically stable denies any shortness of breath so have low suspicion for PE.  Ultrasound was negative.  Hemoglobin is stable.  Repeat UA without evidence of RBCs.  Discussed with patient that she feels comfortable going home at this time we will hold off on aspirin.  I discussed the provisional nature of ED diagnosis, the treatment so far, the ongoing plan of care, follow up appointments and return precautions with the patient and any family or support people present. They expressed understanding and agreed with the plan, discharged home.     ____________________________________________   FINAL CLINICAL IMPRESSION(S) / ED DIAGNOSES   Final diagnoses:  Left leg pain      MEDICATIONS GIVEN DURING THIS VISIT:  Medications - No data to display   ED Discharge Orders    None       Note:  This document was prepared using Dragon voice recognition software and may include unintentional dictation errors.   Concha Se, MD 01/14/21 1048

## 2021-01-14 NOTE — ED Triage Notes (Signed)
Pt reports 2 weeks ago she went to UC and they told her she had blood in her urine and she may have a kidney stone. Pt also reports pain intermittently in her left leg. Pt denies pain at this time was just concerned because she takes a lot of vitamins and a baby asa

## 2021-01-14 NOTE — Discharge Instructions (Addendum)
Ultrasound was negative.  No blood in the urine.  Return to the ER if you develop worsening back pain, dark stools or any other concerns.  Stop taking aspirin for now.  Return to the ER with any other concerns    IMPRESSION: No evidence of deep venous thrombosis in the left lower extremity.

## 2021-01-18 DIAGNOSIS — N3000 Acute cystitis without hematuria: Secondary | ICD-10-CM | POA: Diagnosis not present

## 2021-01-18 DIAGNOSIS — F411 Generalized anxiety disorder: Secondary | ICD-10-CM | POA: Diagnosis not present

## 2021-01-18 DIAGNOSIS — U071 COVID-19: Secondary | ICD-10-CM | POA: Diagnosis not present

## 2021-02-22 DIAGNOSIS — E039 Hypothyroidism, unspecified: Secondary | ICD-10-CM | POA: Diagnosis not present

## 2021-02-27 DIAGNOSIS — G4700 Insomnia, unspecified: Secondary | ICD-10-CM | POA: Diagnosis not present

## 2021-02-27 DIAGNOSIS — E039 Hypothyroidism, unspecified: Secondary | ICD-10-CM | POA: Diagnosis not present

## 2021-02-27 DIAGNOSIS — F331 Major depressive disorder, recurrent, moderate: Secondary | ICD-10-CM | POA: Diagnosis not present

## 2021-02-27 DIAGNOSIS — F411 Generalized anxiety disorder: Secondary | ICD-10-CM | POA: Diagnosis not present

## 2021-04-13 DIAGNOSIS — N760 Acute vaginitis: Secondary | ICD-10-CM | POA: Diagnosis not present

## 2021-04-13 DIAGNOSIS — N3000 Acute cystitis without hematuria: Secondary | ICD-10-CM | POA: Diagnosis not present

## 2021-04-24 DIAGNOSIS — L820 Inflamed seborrheic keratosis: Secondary | ICD-10-CM | POA: Diagnosis not present

## 2021-05-22 DIAGNOSIS — E039 Hypothyroidism, unspecified: Secondary | ICD-10-CM | POA: Diagnosis not present

## 2021-05-24 DIAGNOSIS — F411 Generalized anxiety disorder: Secondary | ICD-10-CM | POA: Diagnosis not present

## 2021-05-24 DIAGNOSIS — E039 Hypothyroidism, unspecified: Secondary | ICD-10-CM | POA: Diagnosis not present

## 2021-05-24 DIAGNOSIS — F331 Major depressive disorder, recurrent, moderate: Secondary | ICD-10-CM | POA: Diagnosis not present

## 2021-05-24 DIAGNOSIS — G4700 Insomnia, unspecified: Secondary | ICD-10-CM | POA: Diagnosis not present

## 2021-06-06 DIAGNOSIS — H6122 Impacted cerumen, left ear: Secondary | ICD-10-CM | POA: Diagnosis not present

## 2021-06-06 DIAGNOSIS — Z23 Encounter for immunization: Secondary | ICD-10-CM | POA: Diagnosis not present

## 2021-06-06 DIAGNOSIS — H6121 Impacted cerumen, right ear: Secondary | ICD-10-CM | POA: Diagnosis not present

## 2021-06-06 DIAGNOSIS — H6123 Impacted cerumen, bilateral: Secondary | ICD-10-CM | POA: Diagnosis not present

## 2021-06-06 DIAGNOSIS — Z1211 Encounter for screening for malignant neoplasm of colon: Secondary | ICD-10-CM | POA: Diagnosis not present

## 2021-06-06 DIAGNOSIS — Z0000 Encounter for general adult medical examination without abnormal findings: Secondary | ICD-10-CM | POA: Diagnosis not present

## 2021-07-11 DIAGNOSIS — Z23 Encounter for immunization: Secondary | ICD-10-CM | POA: Diagnosis not present

## 2021-07-12 ENCOUNTER — Emergency Department
Admission: EM | Admit: 2021-07-12 | Discharge: 2021-07-12 | Disposition: A | Discharge status: Home / Self Care | Payer: BC Managed Care – PPO | Attending: Emergency Medicine | Admitting: Emergency Medicine

## 2021-07-12 ENCOUNTER — Other Ambulatory Visit: Payer: Self-pay

## 2021-07-12 ENCOUNTER — Emergency Department: Payer: BC Managed Care – PPO

## 2021-07-12 DIAGNOSIS — Z87891 Personal history of nicotine dependence: Secondary | ICD-10-CM | POA: Insufficient documentation

## 2021-07-12 DIAGNOSIS — R202 Paresthesia of skin: Secondary | ICD-10-CM | POA: Insufficient documentation

## 2021-07-12 DIAGNOSIS — R0789 Other chest pain: Secondary | ICD-10-CM | POA: Diagnosis not present

## 2021-07-12 DIAGNOSIS — R079 Chest pain, unspecified: Secondary | ICD-10-CM

## 2021-07-12 DIAGNOSIS — M79602 Pain in left arm: Secondary | ICD-10-CM | POA: Diagnosis not present

## 2021-07-12 DIAGNOSIS — M5412 Radiculopathy, cervical region: Secondary | ICD-10-CM | POA: Insufficient documentation

## 2021-07-12 LAB — BASIC METABOLIC PANEL
Anion gap: 9 (ref 5–15)
BUN: 15 mg/dL (ref 6–20)
CO2: 28 mmol/L (ref 22–32)
Calcium: 9.6 mg/dL (ref 8.9–10.3)
Chloride: 102 mmol/L (ref 98–111)
Creatinine, Ser: 0.77 mg/dL (ref 0.44–1.00)
GFR, Estimated: >60 mL/min (ref >60)
Glucose, Bld: 125 mg/dL — ABNORMAL HIGH (ref 70–99)
Potassium: 4.7 mmol/L (ref 3.5–5.1)
Sodium: 139 mmol/L (ref 135–145)

## 2021-07-12 LAB — CBC
HCT: 43.3 % (ref 36.0–46.0)
Hemoglobin: 14.3 g/dL (ref 12.0–15.0)
MCH: 28.4 pg (ref 26.0–34.0)
MCHC: 33 g/dL (ref 30.0–36.0)
MCV: 86.1 fL (ref 80.0–100.0)
Platelets: 299 10*3/uL (ref 150–400)
RBC: 5.03 MIL/uL (ref 3.87–5.11)
RDW: 13.5 % (ref 11.5–15.5)
WBC: 7.9 10*3/uL (ref 4.0–10.5)
nRBC: 0 % (ref 0.0–0.2)

## 2021-07-12 LAB — TROPONIN I (HIGH SENSITIVITY)
Troponin I (High Sensitivity): <2 ng/L (ref <18)
Troponin I (High Sensitivity): <2 ng/L (ref <18)

## 2021-07-12 LAB — POC URINE PREG, ED: Preg Test, Ur: NEGATIVE

## 2021-07-12 LAB — TSH: TSH: 0.52 u[IU]/mL (ref 0.350–4.500)

## 2021-07-12 NOTE — ED Triage Notes (Signed)
Pt comes with c/o mid sternal CP that started 3 days ago. Pt states today it has been hurting in her left arm. Pt denies any SOB

## 2021-07-12 NOTE — ED Provider Notes (Signed)
[facility] Emergency Department Provider Note   ____________________________________________   Event Date/Time   First MD Initiated Contact with Patient 07/12/21 1751     (approximate)  I have reviewed the triage vital signs and the nursing notes.   HISTORY  Chief Complaint Chest Pain    HPI [place] Cataleya Cristina is a 50 y.o. female with past medical history of GERD and hypothyroidism who presents to the ED complaining of chest pain.  Patient reports that she has had 3 days of intermittent pain in the center of her chest which she describes as a tightness.  Pain is not exacerbated or alleviated by anything in particular and she denies any associated fevers, cough, or shortness of breath.  She does state that she has had occasional pain in her neck with numbness and tingling radiating down her left arm.  She denies any weakness in her arm, has not had any numbness or weakness in other extremities.  She has not taken anything for her symptoms prior to arrival.        Past Medical History:  Diagnosis Date   Chronic back pain    GERD (gastroesophageal reflux disease)    Thyroid disease     There are no problems to display for this patient.   Past Surgical History:  Procedure Laterality Date   APPENDECTOMY     AUGMENTATION MAMMAPLASTY     PLACEMENT OF BREAST IMPLANTS  1997    Prior to Admission medications   Medication Sig Start Date End Date Taking? Authorizing Provider  levothyroxine (SYNTHROID, LEVOTHROID) 125 MCG tablet TAKE 1 TABLET ONCE DAILY WITH A FULL GLASS OF WATER ON EMPTY STOMACH 30-60MINS BEFORE BREAKFAST 08/17/16   [provider]  naproxen (NAPROSYN) 500 MG tablet Take 1 tablet (500 mg total) by mouth 2 (two) times daily. 01/12/21   Wieters, Hallie C, PA-C  tamsulosin (FLOMAX) 0.4 MG CAPS capsule Take 1 capsule (0.4 mg total) by mouth daily. 01/12/21   Wieters, Hallie C, PA-C  tiZANidine (ZANAFLEX) 4 MG tablet Take 1 tablet  (4 mg total) by mouth every 6 (six) hours as needed for muscle spasms. 01/12/21   Wieters, Hallie C, PA-C    Allergies Benadryl [diphenhydramine]  Family History  Problem Relation Age of Onset   Diabetes Mother    Osteoporosis Mother    Bipolar disorder Mother    Lymphoma Father    Kidney disease Maternal Grandmother    Diabetes Paternal Grandmother    Colon cancer Neg Hx    Stomach cancer Neg Hx    Rectal cancer Neg Hx    Esophageal cancer Neg Hx    Liver cancer Neg Hx     Social History Social History   Tobacco Use   Smoking status: Former    Pack years: 0.00   Smokeless tobacco: Never  Vaping Use   Vaping Use: Never used  Substance Use Topics   Alcohol use: Yes    Comment: occasionally   Drug use: No    Review of Systems  Constitutional: No fever/chills Eyes: No visual changes. ENT: No sore throat. Cardiovascular: Positive for chest pain. Respiratory: Denies shortness of breath. Gastrointestinal: No abdominal pain.  No nausea, no vomiting.  No diarrhea.  No constipation. Genitourinary: Negative for dysuria. Musculoskeletal: Negative for back pain.  Positive for neck pain. Skin: Negative for rash. Neurological: Negative for headaches, positive for arm numbness and tingling.  ____________________________________________   PHYSICAL EXAM:  VITAL SIGNS: ED Triage Vitals  Enc  Vitals Group     BP 07/12/21 1412 (!) 153/76     Pulse Rate 07/12/21 1412 87     Resp 07/12/21 1412 18     Temp 07/12/21 1412 98.4 F (36.9 C)     Temp Source 07/12/21 1412 Oral     SpO2 07/12/21 1412 94 %     Weight --      Height --      Head Circumference --      Peak Flow --      Pain Score 07/12/21 1408 0     Pain Loc --      Pain Edu? --      Excl. in GC? --     Constitutional: Alert and oriented. Eyes: Conjunctivae are normal. Head: Atraumatic. Nose: No congestion/rhinnorhea. Mouth/Throat: Mucous membranes are moist. Neck: Normal ROM Cardiovascular: Normal rate,  regular rhythm. Grossly normal heart sounds.  2+ radial pulses bilaterally. Respiratory: Normal respiratory effort.  No retractions. Lungs CTAB.  No chest wall tenderness to palpation. Gastrointestinal: Soft and nontender. No distention. Genitourinary: deferred Musculoskeletal: No lower extremity tenderness nor edema. Neurologic:  Normal speech and language. No gross focal neurologic deficits are appreciated. Skin:  Skin is warm, dry and intact. No rash noted. Psychiatric: Mood and affect are normal. Speech and behavior are normal.  ____________________________________________   LABS (all labs ordered are listed, but only abnormal results are displayed)  Labs Reviewed  BASIC METABOLIC PANEL - Abnormal; Notable for the following components:      Result Value   Glucose, Bld 125 (*)    All other components within normal limits  CBC  TSH  POC URINE PREG, ED  TROPONIN I (HIGH SENSITIVITY)  TROPONIN I (HIGH SENSITIVITY)   ____________________________________________  EKG  ED ECG REPORT I, Chesley Noon, the attending physician, personally viewed and interpreted this ECG.   Date: 07/12/2021  EKG Time: 14:05  Rate: 87  Rhythm: normal sinus rhythm  Axis: Normal  Intervals:none  ST&T Change: None   PROCEDURES  Procedure(s) performed (including Critical Care):  Procedures   ____________________________________________   INITIAL IMPRESSION / ASSESSMENT AND PLAN / ED COURSE      50 year old female with past medical history of GERD and hypothyroidism who presents to the ED complaining of intermittent chest pain for the past 3 days along with some pain radiating down her neck and into her left arm with numbness and tingling.  EKG shows no evidence of arrhythmia or ischemia and 2 sets of troponin are negative, I doubt ACS.  Patient now states that chest pain has resolved and with reassuring vital signs I doubt PE.  Chest x-ray reviewed by me and shows no infiltrate, edema, or  effusion.  Remainder of labs are unremarkable.  Patient's neck symptoms and arm tingling sound consistent with cervical radiculopathy, she is neurovascularly intact to her left upper extremity with no focal neurologic deficits.  Repeat troponin is negative, TSH is within normal limits.  Patient is appropriate for discharge home with PCP follow-up, was counseled to return to the ED for new or worsening symptoms.  Patient agrees with plan.      ____________________________________________   FINAL CLINICAL IMPRESSION(S) / ED DIAGNOSES  Final diagnoses:  Nonspecific chest pain  Cervical radiculopathy     ED Discharge Orders     None        Note:  This document was prepared using Dragon voice recognition software and may include unintentional dictation errors.    Chesley Noon, MD  07/12/21 1936  

## 2021-07-12 NOTE — ED Notes (Signed)
Report received from Sarah, RN.

## 2021-07-17 DIAGNOSIS — L02216 Cutaneous abscess of umbilicus: Secondary | ICD-10-CM | POA: Diagnosis not present

## 2021-07-17 DIAGNOSIS — W4904XA Ring or other jewelry causing external constriction, initial encounter: Secondary | ICD-10-CM | POA: Diagnosis not present

## 2021-10-09 IMAGING — MG DIGITAL SCREENING BREAST BILAT IMPLANT W/ TOMO W/ CAD
8 of 12 series · 8 of 24 positions shown · non-contrast
Comparison: Previous exam(s).

CLINICAL DATA: Screening.

EXAM:
DIGITAL SCREENING BILATERAL MAMMOGRAM WITH IMPLANTS, CAD AND TOMO
The patient has retroglandular implants. Standard and implant
displaced views were performed.

[L CC]
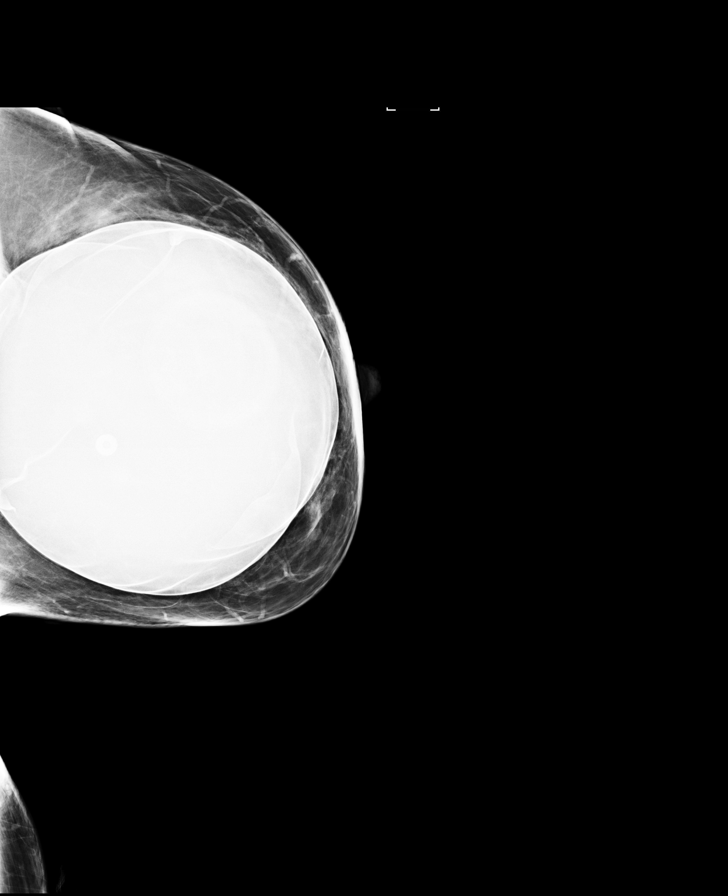

[R CC]
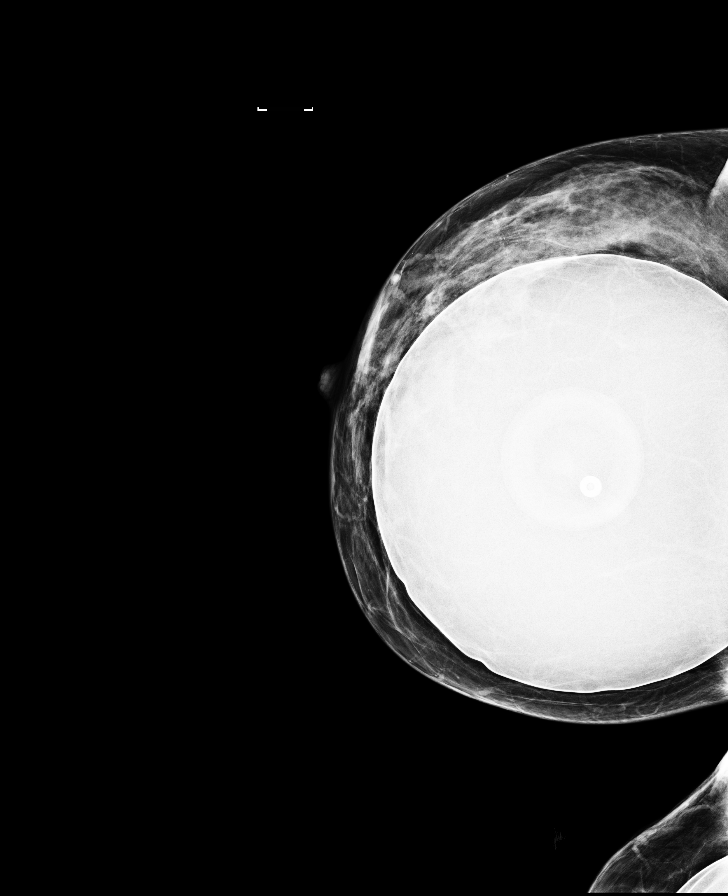

[L MLO]
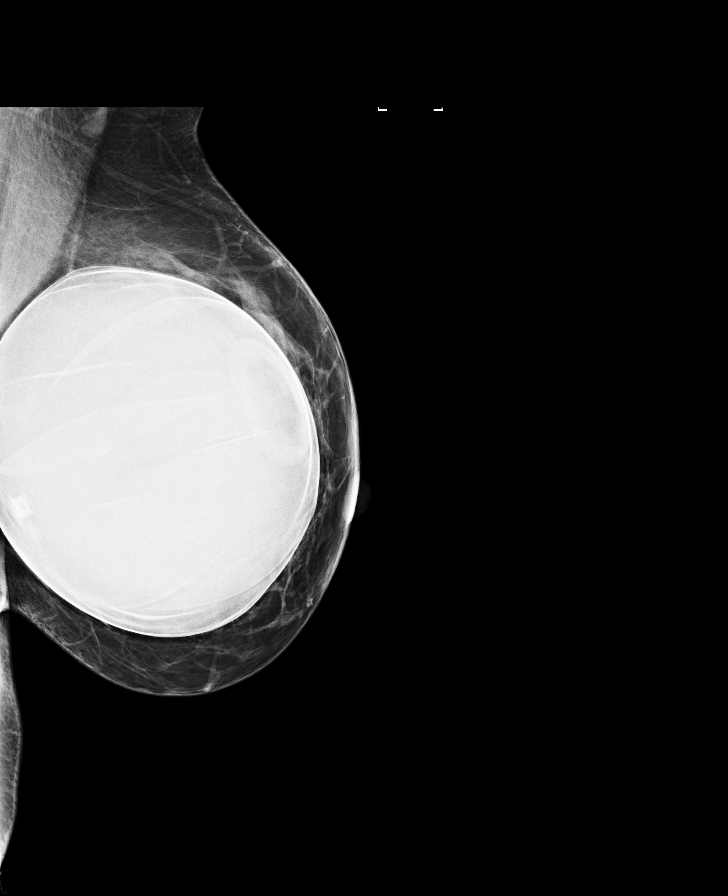

[R MLO]
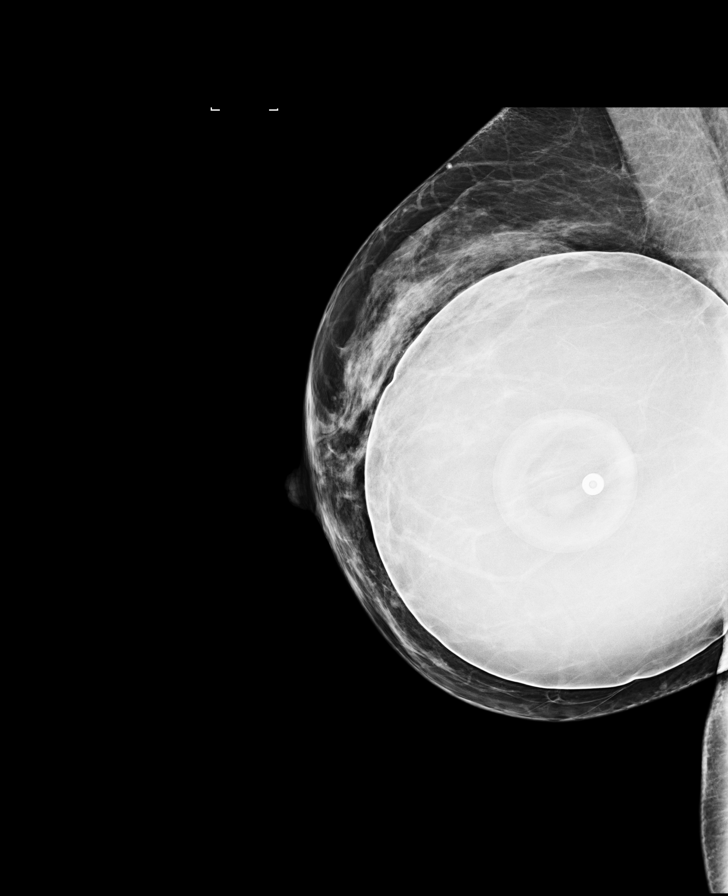

[R MLO synth-2D]
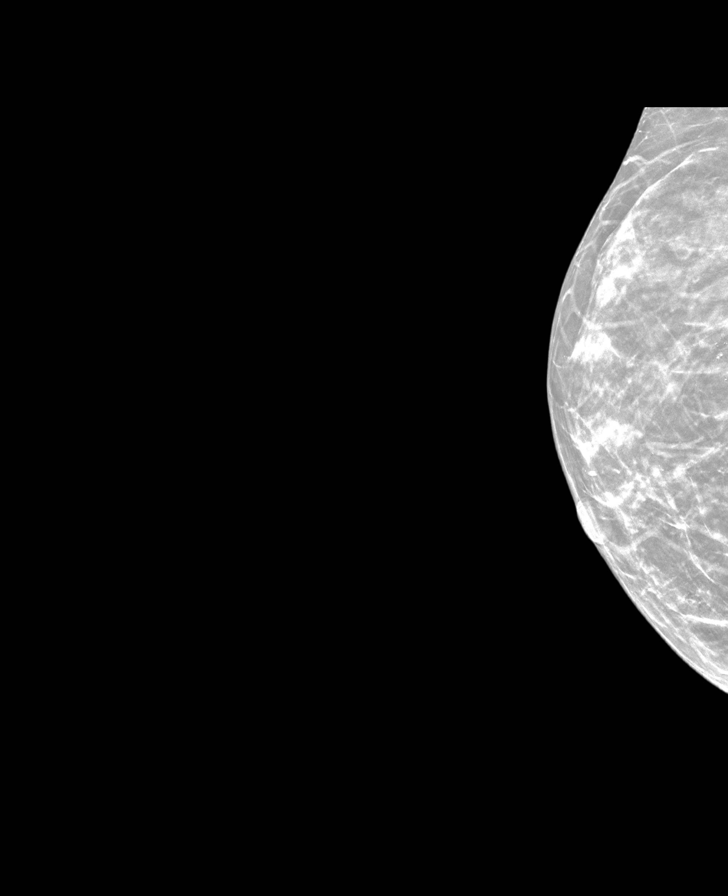

[L CC synth-2D]
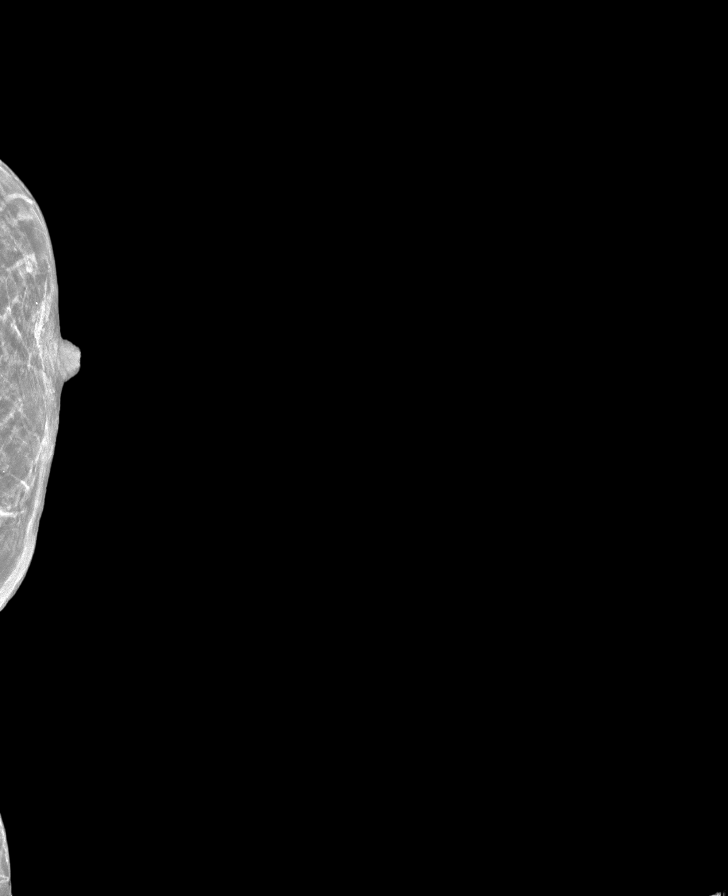

[L MLO synth-2D]
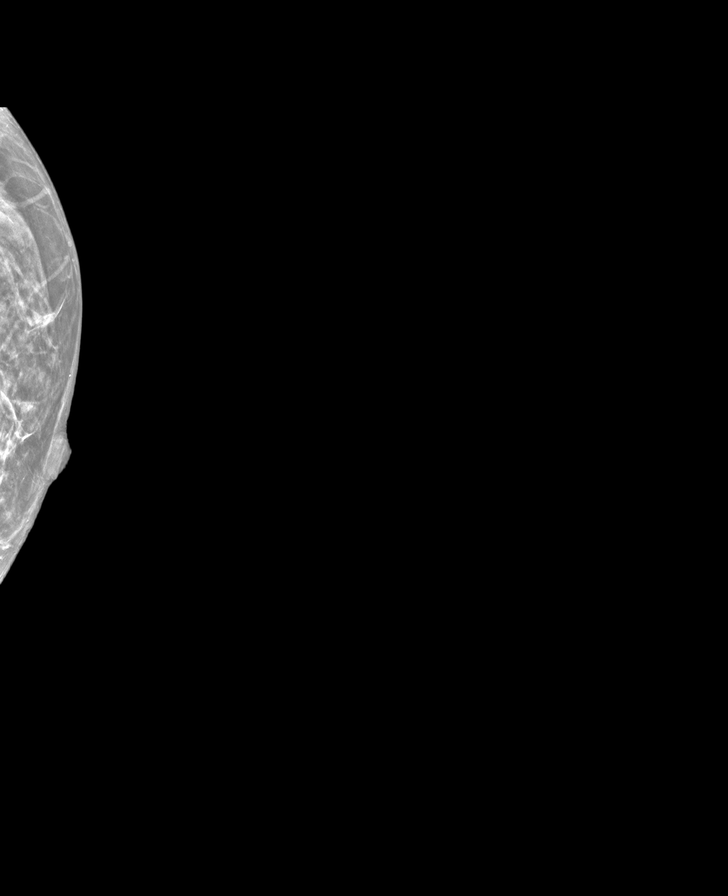

[R CC synth-2D]
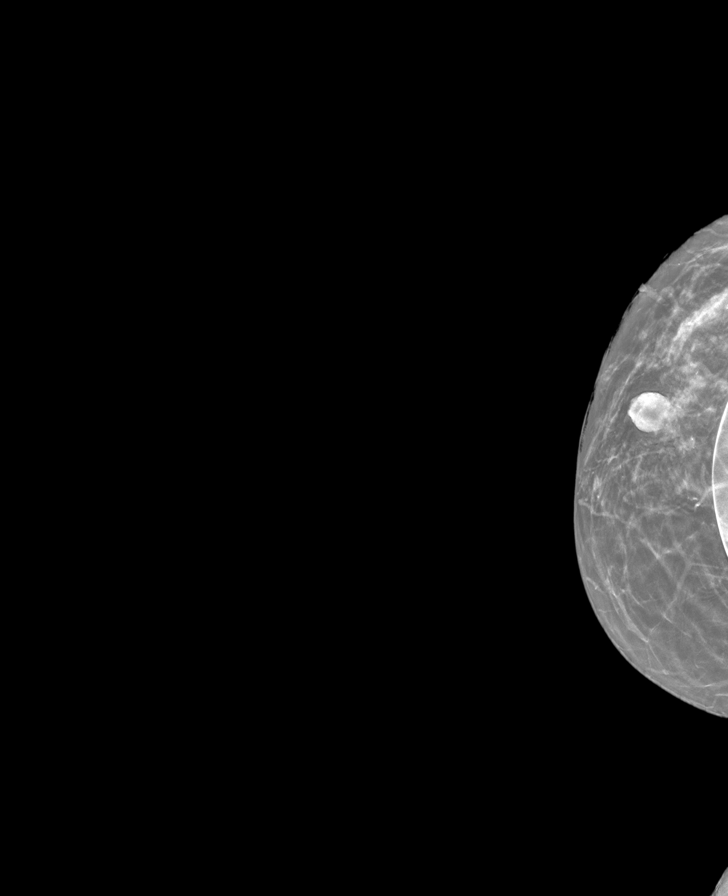

[8 of 24 positions shown; findings below may reference images not displayed]

ACR Breast Density Category b: There are scattered areas of
fibroglandular density.
FINDINGS: There are no findings suspicious for malignancy. Images were
processed with CAD.
IMPRESSION: No mammographic evidence of malignancy. A result letter of this
screening mammogram will be mailed directly to the patient.

RECOMMENDATION:
Screening mammogram in one year. (Code:VZ-A-PDF)

BI-RADS CATEGORY  1:  Negative.

## 2021-10-24 DIAGNOSIS — L820 Inflamed seborrheic keratosis: Secondary | ICD-10-CM | POA: Diagnosis not present

## 2021-10-24 DIAGNOSIS — D225 Melanocytic nevi of trunk: Secondary | ICD-10-CM | POA: Diagnosis not present

## 2021-10-24 DIAGNOSIS — L578 Other skin changes due to chronic exposure to nonionizing radiation: Secondary | ICD-10-CM | POA: Diagnosis not present

## 2022-01-17 DIAGNOSIS — E782 Mixed hyperlipidemia: Secondary | ICD-10-CM | POA: Diagnosis not present

## 2022-01-17 DIAGNOSIS — R5383 Other fatigue: Secondary | ICD-10-CM | POA: Diagnosis not present

## 2022-01-17 DIAGNOSIS — E039 Hypothyroidism, unspecified: Secondary | ICD-10-CM | POA: Diagnosis not present

## 2022-01-17 DIAGNOSIS — K219 Gastro-esophageal reflux disease without esophagitis: Secondary | ICD-10-CM | POA: Diagnosis not present

## 2022-01-22 DIAGNOSIS — F411 Generalized anxiety disorder: Secondary | ICD-10-CM | POA: Diagnosis not present

## 2022-01-22 DIAGNOSIS — E559 Vitamin D deficiency, unspecified: Secondary | ICD-10-CM | POA: Diagnosis not present

## 2022-01-22 DIAGNOSIS — E782 Mixed hyperlipidemia: Secondary | ICD-10-CM | POA: Diagnosis not present

## 2022-01-22 DIAGNOSIS — E039 Hypothyroidism, unspecified: Secondary | ICD-10-CM | POA: Diagnosis not present

## 2022-05-15 IMAGING — US US EXTREM LOW VENOUS*L*
1 series · 14 of 24 positions shown · non-contrast
Comparison: None.

CLINICAL DATA: Left lower extremity pain post COVID for 3 days.

EXAM:
LEFT LOWER EXTREMITY VENOUS DOPPLER ULTRASOUND
TECHNIQUE: Gray-scale sonography with compression, as well as color and duplex
ultrasound, were performed to evaluate the deep venous system(s)
from the level of the common femoral vein through the popliteal and
proximal calf veins.

[Series 1: us venous img lower uni left (dvt) · portal-venous · 14 of 32 slices shown]
[im 1/32]
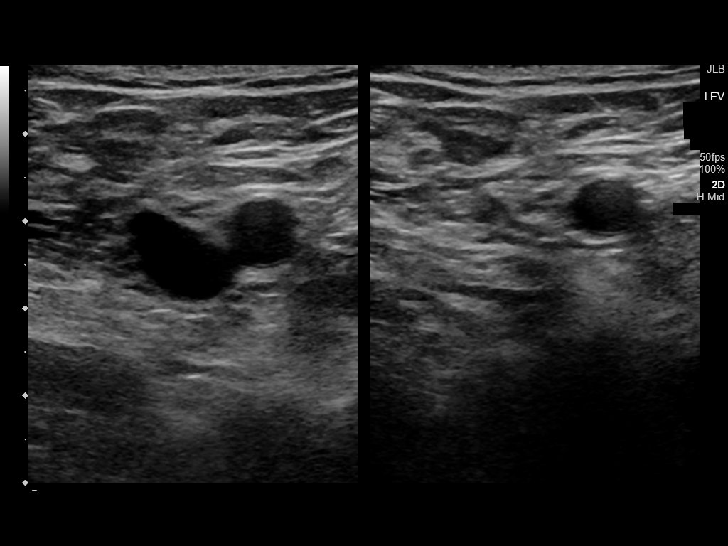
[im 3/32]
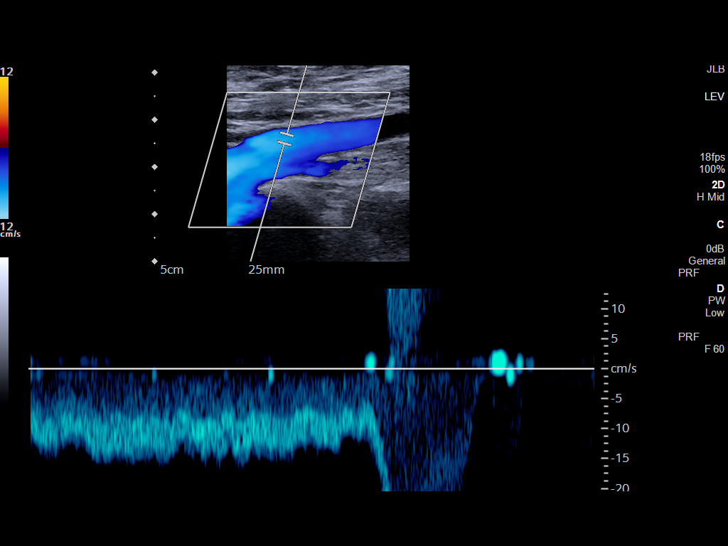
[im 6/32]
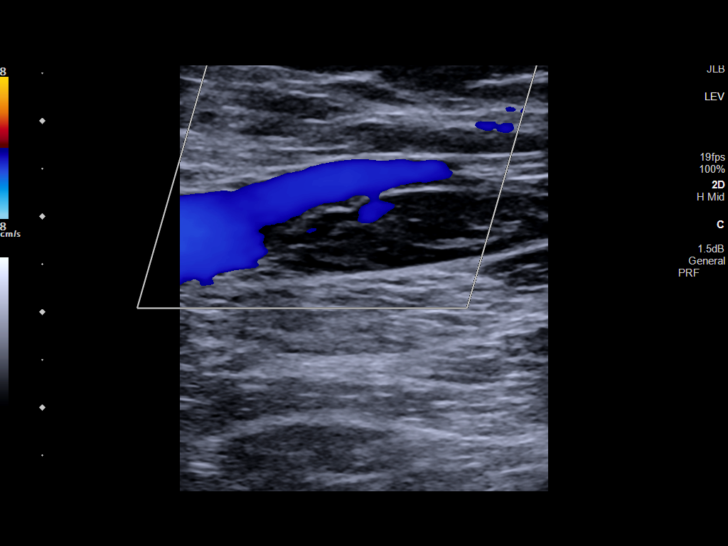
[im 9/32]
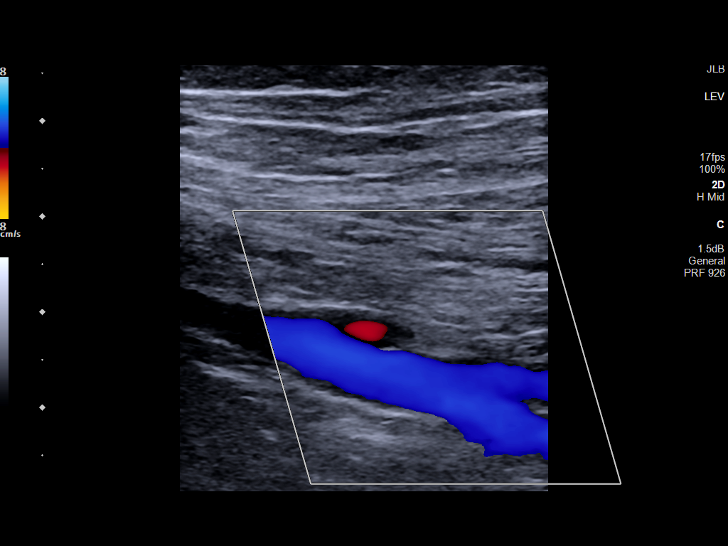
[im 10/32]
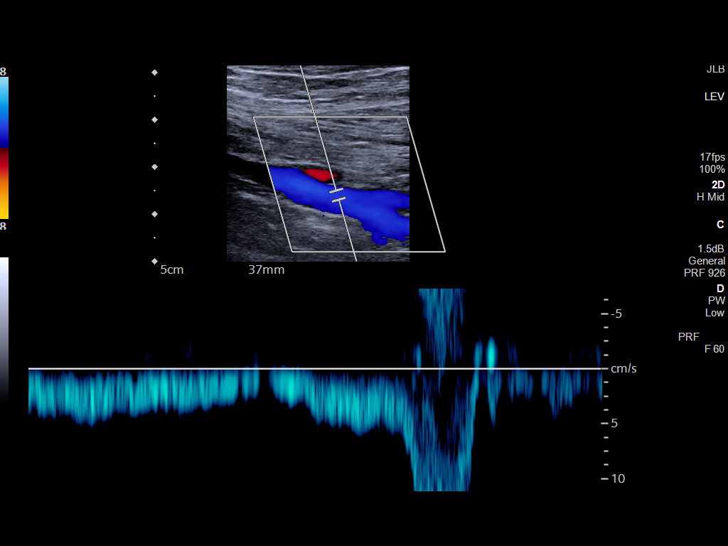
[im 13/32]
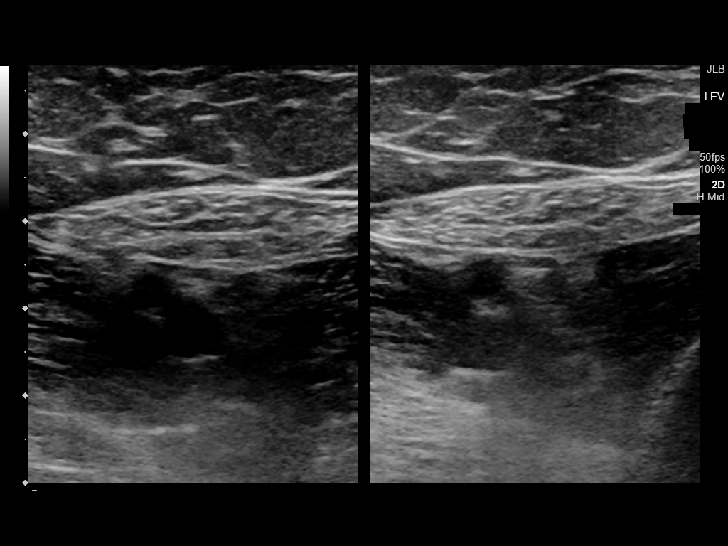
[im 15/32]
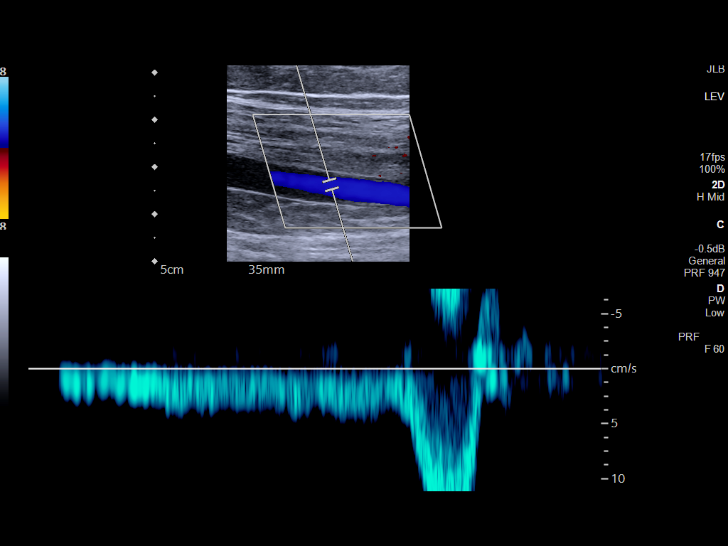
[im 17/32]
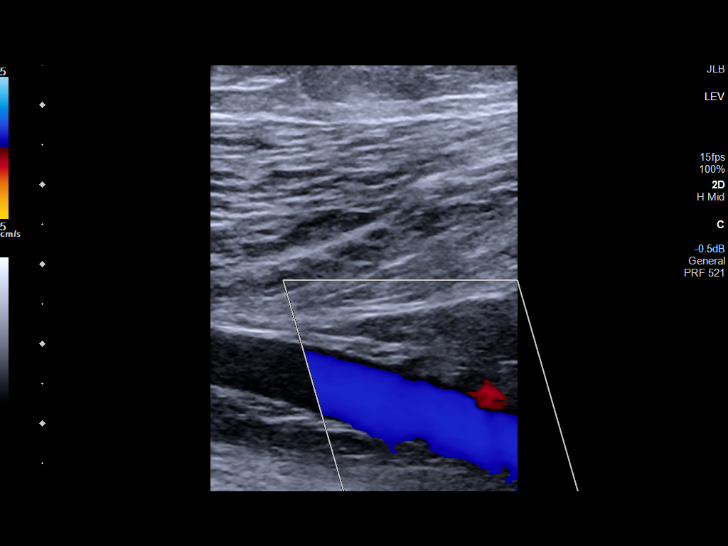
[im 19/32]
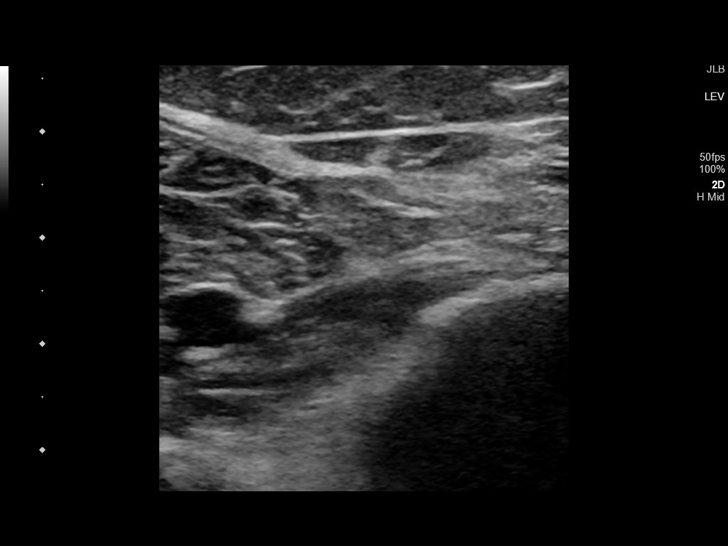
[im 22/32]
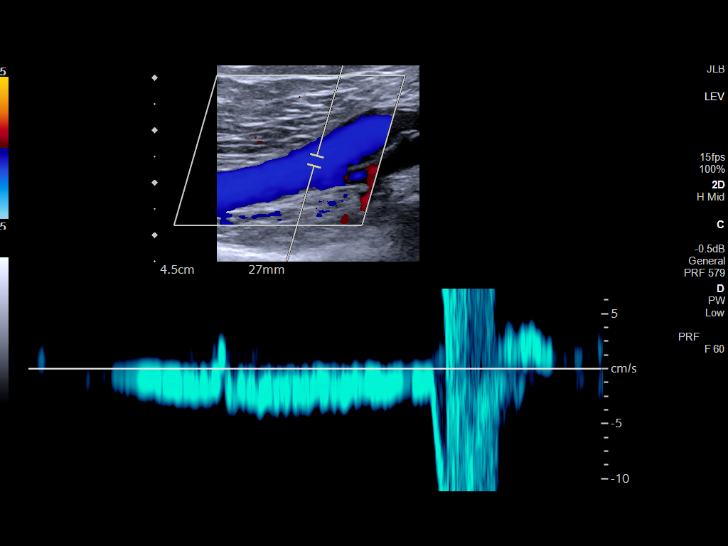
[im 25/32]
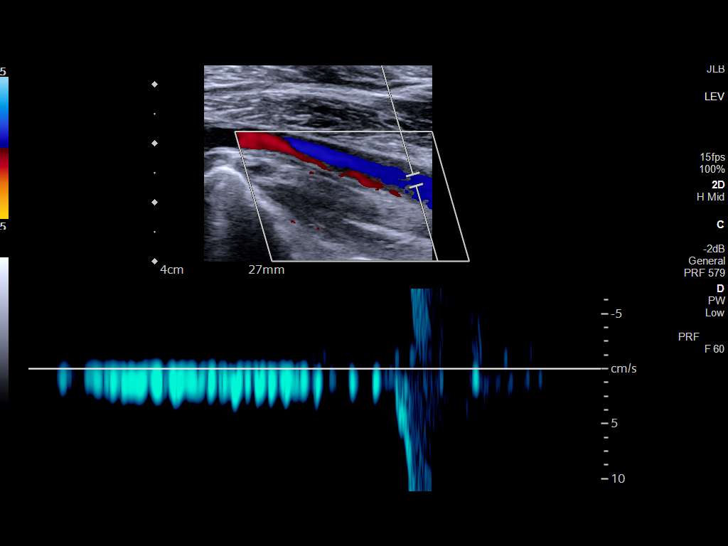
[im 26/32]
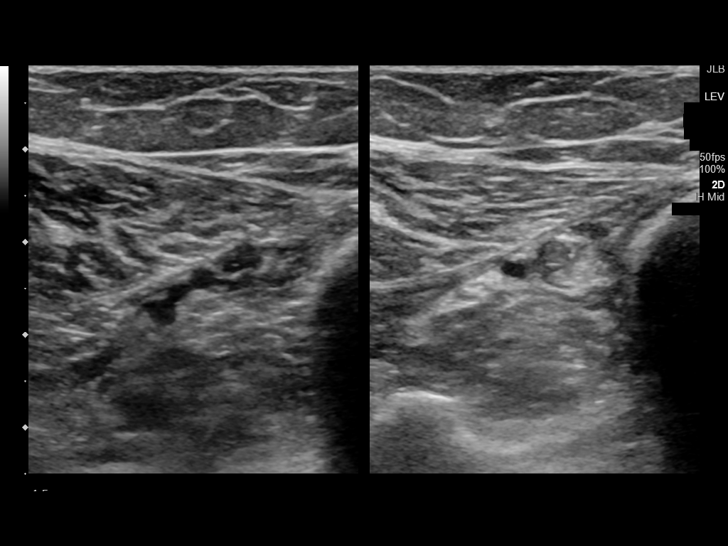
[im 29/32]
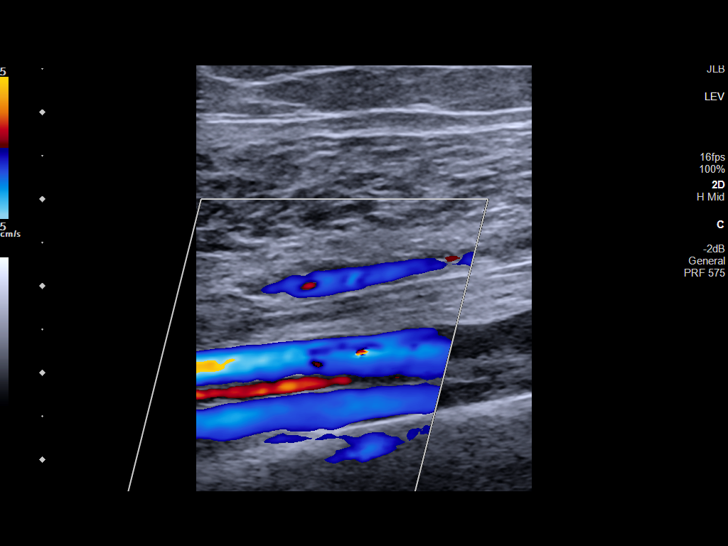
[im 32/32]
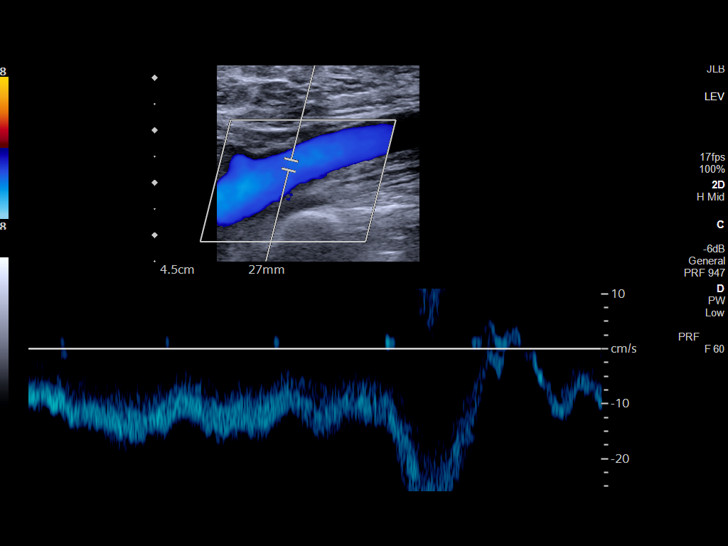

[14 of 24 positions shown; findings below may reference images not displayed]

FINDINGS: VENOUS

Normal compressibility of the common femoral, superficial femoral,
and popliteal veins, as well as the visualized calf veins.
Visualized portions of profunda femoral vein and great saphenous
vein unremarkable. No filling defects to suggest DVT on grayscale or
color Doppler imaging. Doppler waveforms show normal direction of
venous flow, normal respiratory plasticity and response to
augmentation.

Limited views of the contralateral common femoral vein are
unremarkable.

OTHER

None.

Limitations: none
IMPRESSION: No evidence of deep venous thrombosis in the left lower extremity.

## 2022-05-23 DIAGNOSIS — E039 Hypothyroidism, unspecified: Secondary | ICD-10-CM | POA: Diagnosis not present

## 2022-05-31 DIAGNOSIS — F331 Major depressive disorder, recurrent, moderate: Secondary | ICD-10-CM | POA: Diagnosis not present

## 2022-05-31 DIAGNOSIS — G4700 Insomnia, unspecified: Secondary | ICD-10-CM | POA: Diagnosis not present

## 2022-05-31 DIAGNOSIS — E039 Hypothyroidism, unspecified: Secondary | ICD-10-CM | POA: Diagnosis not present

## 2022-05-31 DIAGNOSIS — F411 Generalized anxiety disorder: Secondary | ICD-10-CM | POA: Diagnosis not present

## 2022-06-28 ENCOUNTER — Other Ambulatory Visit: Payer: Self-pay | Admitting: Family Medicine

## 2022-06-28 DIAGNOSIS — Z1231 Encounter for screening mammogram for malignant neoplasm of breast: Secondary | ICD-10-CM

## 2022-07-11 ENCOUNTER — Ambulatory Visit
Admission: RE | Admit: 2022-07-11 | Discharge: 2022-07-11 | Disposition: A | Discharge status: Home / Self Care | Payer: BC Managed Care – PPO | Source: Ambulatory Visit | Attending: Family Medicine | Admitting: Family Medicine

## 2022-07-11 DIAGNOSIS — Z1231 Encounter for screening mammogram for malignant neoplasm of breast: Secondary | ICD-10-CM

## 2022-11-19 DIAGNOSIS — E782 Mixed hyperlipidemia: Secondary | ICD-10-CM | POA: Diagnosis not present

## 2022-11-19 DIAGNOSIS — Z114 Encounter for screening for human immunodeficiency virus [HIV]: Secondary | ICD-10-CM | POA: Diagnosis not present

## 2022-11-19 DIAGNOSIS — Z0000 Encounter for general adult medical examination without abnormal findings: Secondary | ICD-10-CM | POA: Diagnosis not present

## 2022-12-04 DIAGNOSIS — E039 Hypothyroidism, unspecified: Secondary | ICD-10-CM | POA: Diagnosis not present

## 2022-12-04 DIAGNOSIS — Z0000 Encounter for general adult medical examination without abnormal findings: Secondary | ICD-10-CM | POA: Diagnosis not present

## 2023-01-04 DIAGNOSIS — M79605 Pain in left leg: Secondary | ICD-10-CM | POA: Diagnosis not present

## 2023-01-08 DIAGNOSIS — M79662 Pain in left lower leg: Secondary | ICD-10-CM | POA: Diagnosis not present

## 2023-05-20 DIAGNOSIS — E039 Hypothyroidism, unspecified: Secondary | ICD-10-CM | POA: Diagnosis not present

## 2023-07-24 DIAGNOSIS — E0590 Thyrotoxicosis, unspecified without thyrotoxic crisis or storm: Secondary | ICD-10-CM | POA: Diagnosis not present

## 2023-07-24 DIAGNOSIS — Z6825 Body mass index (BMI) 25.0-25.9, adult: Secondary | ICD-10-CM | POA: Diagnosis not present

## 2023-07-24 DIAGNOSIS — N950 Postmenopausal bleeding: Secondary | ICD-10-CM | POA: Diagnosis not present

## 2023-07-24 DIAGNOSIS — Z01411 Encounter for gynecological examination (general) (routine) with abnormal findings: Secondary | ICD-10-CM | POA: Diagnosis not present

## 2023-07-24 DIAGNOSIS — Z124 Encounter for screening for malignant neoplasm of cervix: Secondary | ICD-10-CM | POA: Diagnosis not present

## 2023-07-24 DIAGNOSIS — Z1151 Encounter for screening for human papillomavirus (HPV): Secondary | ICD-10-CM | POA: Diagnosis not present

## 2023-07-26 ENCOUNTER — Encounter: Payer: Self-pay | Admitting: Family Medicine

## 2023-09-12 DIAGNOSIS — E039 Hypothyroidism, unspecified: Secondary | ICD-10-CM | POA: Diagnosis not present

## 2023-12-12 DIAGNOSIS — Z0000 Encounter for general adult medical examination without abnormal findings: Secondary | ICD-10-CM | POA: Diagnosis not present

## 2023-12-12 DIAGNOSIS — H04123 Dry eye syndrome of bilateral lacrimal glands: Secondary | ICD-10-CM | POA: Diagnosis not present

## 2023-12-12 DIAGNOSIS — E039 Hypothyroidism, unspecified: Secondary | ICD-10-CM | POA: Diagnosis not present

## 2023-12-12 DIAGNOSIS — Z13220 Encounter for screening for lipoid disorders: Secondary | ICD-10-CM | POA: Diagnosis not present

## 2023-12-12 DIAGNOSIS — R5383 Other fatigue: Secondary | ICD-10-CM | POA: Diagnosis not present

## 2024-01-02 DIAGNOSIS — R635 Abnormal weight gain: Secondary | ICD-10-CM | POA: Diagnosis not present

## 2024-01-02 DIAGNOSIS — R5383 Other fatigue: Secondary | ICD-10-CM | POA: Diagnosis not present

## 2024-01-02 DIAGNOSIS — Z1330 Encounter for screening examination for mental health and behavioral disorders, unspecified: Secondary | ICD-10-CM | POA: Diagnosis not present

## 2024-01-02 DIAGNOSIS — E559 Vitamin D deficiency, unspecified: Secondary | ICD-10-CM | POA: Diagnosis not present

## 2024-01-02 DIAGNOSIS — E063 Autoimmune thyroiditis: Secondary | ICD-10-CM | POA: Diagnosis not present

## 2024-01-02 DIAGNOSIS — R5381 Other malaise: Secondary | ICD-10-CM | POA: Diagnosis not present

## 2024-07-30 DIAGNOSIS — R0602 Shortness of breath: Secondary | ICD-10-CM | POA: Diagnosis not present

## 2024-07-30 DIAGNOSIS — R059 Cough, unspecified: Secondary | ICD-10-CM | POA: Diagnosis not present

## 2024-07-30 DIAGNOSIS — Z5321 Procedure and treatment not carried out due to patient leaving prior to being seen by health care provider: Secondary | ICD-10-CM | POA: Diagnosis not present

## 2024-08-04 DIAGNOSIS — E039 Hypothyroidism, unspecified: Secondary | ICD-10-CM | POA: Diagnosis not present

## 2024-08-04 DIAGNOSIS — Z1231 Encounter for screening mammogram for malignant neoplasm of breast: Secondary | ICD-10-CM | POA: Diagnosis not present

## 2024-08-04 DIAGNOSIS — R059 Cough, unspecified: Secondary | ICD-10-CM | POA: Diagnosis not present
